# Patient Record
Sex: Female | Born: 1935 | Race: White | Hispanic: No | Smoking: Former smoker
Health system: Southern US, Community
[De-identification: ages and names within clinical notes are randomized; demographics above are authoritative.]

## PROBLEM LIST (undated history)

## (undated) DIAGNOSIS — I10 Essential (primary) hypertension: Secondary | ICD-10-CM

## (undated) DIAGNOSIS — M109 Gout, unspecified: Secondary | ICD-10-CM

## (undated) DIAGNOSIS — E079 Disorder of thyroid, unspecified: Secondary | ICD-10-CM

## (undated) DIAGNOSIS — I509 Heart failure, unspecified: Secondary | ICD-10-CM

## (undated) DIAGNOSIS — K219 Gastro-esophageal reflux disease without esophagitis: Secondary | ICD-10-CM

## (undated) DIAGNOSIS — T8859XA Other complications of anesthesia, initial encounter: Secondary | ICD-10-CM

## (undated) DIAGNOSIS — E039 Hypothyroidism, unspecified: Secondary | ICD-10-CM

## (undated) DIAGNOSIS — F419 Anxiety disorder, unspecified: Secondary | ICD-10-CM

## (undated) DIAGNOSIS — I4891 Unspecified atrial fibrillation: Secondary | ICD-10-CM

## (undated) DIAGNOSIS — Z972 Presence of dental prosthetic device (complete) (partial): Secondary | ICD-10-CM

## (undated) HISTORY — PX: APPENDECTOMY: SHX54

## (undated) HISTORY — PX: ABDOMINAL HYSTERECTOMY: SHX81

---

## 2003-10-19 ENCOUNTER — Other Ambulatory Visit: Payer: Self-pay

## 2007-06-20 ENCOUNTER — Ambulatory Visit: Payer: Self-pay | Admitting: Family Medicine

## 2013-02-10 DIAGNOSIS — N951 Menopausal and female climacteric states: Secondary | ICD-10-CM | POA: Insufficient documentation

## 2013-02-10 DIAGNOSIS — E785 Hyperlipidemia, unspecified: Secondary | ICD-10-CM | POA: Insufficient documentation

## 2013-02-10 DIAGNOSIS — E669 Obesity, unspecified: Secondary | ICD-10-CM | POA: Insufficient documentation

## 2014-04-29 ENCOUNTER — Ambulatory Visit: Payer: Self-pay | Admitting: Physician Assistant

## 2014-07-01 ENCOUNTER — Emergency Department: Payer: Self-pay | Admitting: Emergency Medicine

## 2015-03-28 DIAGNOSIS — R059 Cough, unspecified: Secondary | ICD-10-CM | POA: Insufficient documentation

## 2016-06-28 ENCOUNTER — Emergency Department: Payer: Medicare Other

## 2016-06-28 ENCOUNTER — Inpatient Hospital Stay
Admission: EM | Admit: 2016-06-28 | Discharge: 2016-07-02 | DRG: 193 | Disposition: A | Discharge status: Home / Self Care | Payer: Medicare Other | Attending: Internal Medicine | Admitting: Internal Medicine

## 2016-06-28 ENCOUNTER — Encounter: Payer: Self-pay | Admitting: *Deleted

## 2016-06-28 ENCOUNTER — Ambulatory Visit (INDEPENDENT_AMBULATORY_CARE_PROVIDER_SITE_OTHER): Payer: Medicare Other

## 2016-06-28 ENCOUNTER — Ambulatory Visit (INDEPENDENT_AMBULATORY_CARE_PROVIDER_SITE_OTHER)
Admission: EM | Admit: 2016-06-28 | Discharge: 2016-06-28 | Disposition: A | Discharge status: Other Acute Inpatient Hospital | Payer: Medicare Other | Source: Home / Self Care | Attending: Family Medicine | Admitting: Family Medicine

## 2016-06-28 DIAGNOSIS — I452 Bifascicular block: Secondary | ICD-10-CM

## 2016-06-28 DIAGNOSIS — J9601 Acute respiratory failure with hypoxia: Secondary | ICD-10-CM | POA: Diagnosis not present

## 2016-06-28 DIAGNOSIS — I4891 Unspecified atrial fibrillation: Secondary | ICD-10-CM | POA: Diagnosis present

## 2016-06-28 DIAGNOSIS — R0602 Shortness of breath: Secondary | ICD-10-CM | POA: Diagnosis not present

## 2016-06-28 DIAGNOSIS — I429 Cardiomyopathy, unspecified: Secondary | ICD-10-CM | POA: Diagnosis not present

## 2016-06-28 DIAGNOSIS — I700 Atherosclerosis of aorta: Secondary | ICD-10-CM | POA: Insufficient documentation

## 2016-06-28 DIAGNOSIS — R05 Cough: Secondary | ICD-10-CM | POA: Insufficient documentation

## 2016-06-28 DIAGNOSIS — I4510 Unspecified right bundle-branch block: Secondary | ICD-10-CM | POA: Insufficient documentation

## 2016-06-28 DIAGNOSIS — Z6834 Body mass index (BMI) 34.0-34.9, adult: Secondary | ICD-10-CM

## 2016-06-28 DIAGNOSIS — E669 Obesity, unspecified: Secondary | ICD-10-CM | POA: Diagnosis present

## 2016-06-28 DIAGNOSIS — J449 Chronic obstructive pulmonary disease, unspecified: Secondary | ICD-10-CM | POA: Diagnosis not present

## 2016-06-28 DIAGNOSIS — J189 Pneumonia, unspecified organism: Secondary | ICD-10-CM | POA: Diagnosis not present

## 2016-06-28 DIAGNOSIS — I444 Left anterior fascicular block: Secondary | ICD-10-CM | POA: Insufficient documentation

## 2016-06-28 DIAGNOSIS — I5021 Acute systolic (congestive) heart failure: Secondary | ICD-10-CM | POA: Diagnosis present

## 2016-06-28 DIAGNOSIS — E876 Hypokalemia: Secondary | ICD-10-CM | POA: Diagnosis not present

## 2016-06-28 DIAGNOSIS — I119 Hypertensive heart disease without heart failure: Secondary | ICD-10-CM

## 2016-06-28 DIAGNOSIS — Z7901 Long term (current) use of anticoagulants: Secondary | ICD-10-CM | POA: Diagnosis not present

## 2016-06-28 DIAGNOSIS — M419 Scoliosis, unspecified: Secondary | ICD-10-CM | POA: Insufficient documentation

## 2016-06-28 DIAGNOSIS — I110 Hypertensive heart disease with heart failure: Secondary | ICD-10-CM | POA: Diagnosis not present

## 2016-06-28 DIAGNOSIS — R0902 Hypoxemia: Secondary | ICD-10-CM

## 2016-06-28 DIAGNOSIS — Z90710 Acquired absence of both cervix and uterus: Secondary | ICD-10-CM | POA: Diagnosis not present

## 2016-06-28 DIAGNOSIS — F329 Major depressive disorder, single episode, unspecified: Secondary | ICD-10-CM | POA: Diagnosis not present

## 2016-06-28 DIAGNOSIS — Z79899 Other long term (current) drug therapy: Secondary | ICD-10-CM | POA: Diagnosis not present

## 2016-06-28 DIAGNOSIS — K219 Gastro-esophageal reflux disease without esophagitis: Secondary | ICD-10-CM | POA: Diagnosis present

## 2016-06-28 DIAGNOSIS — F419 Anxiety disorder, unspecified: Secondary | ICD-10-CM

## 2016-06-28 DIAGNOSIS — E079 Disorder of thyroid, unspecified: Secondary | ICD-10-CM | POA: Insufficient documentation

## 2016-06-28 DIAGNOSIS — E039 Hypothyroidism, unspecified: Secondary | ICD-10-CM | POA: Diagnosis not present

## 2016-06-28 DIAGNOSIS — I493 Ventricular premature depolarization: Secondary | ICD-10-CM

## 2016-06-28 DIAGNOSIS — E785 Hyperlipidemia, unspecified: Secondary | ICD-10-CM | POA: Diagnosis present

## 2016-06-28 DIAGNOSIS — R0600 Dyspnea, unspecified: Secondary | ICD-10-CM

## 2016-06-28 DIAGNOSIS — R062 Wheezing: Secondary | ICD-10-CM

## 2016-06-28 DIAGNOSIS — J9801 Acute bronchospasm: Secondary | ICD-10-CM

## 2016-06-28 DIAGNOSIS — Z87891 Personal history of nicotine dependence: Secondary | ICD-10-CM

## 2016-06-28 DIAGNOSIS — I517 Cardiomegaly: Secondary | ICD-10-CM | POA: Insufficient documentation

## 2016-06-28 DIAGNOSIS — I10 Essential (primary) hypertension: Secondary | ICD-10-CM | POA: Diagnosis present

## 2016-06-28 HISTORY — DX: Essential (primary) hypertension: I10

## 2016-06-28 HISTORY — DX: Gastro-esophageal reflux disease without esophagitis: K21.9

## 2016-06-28 HISTORY — DX: Anxiety disorder, unspecified: F41.9

## 2016-06-28 HISTORY — DX: Disorder of thyroid, unspecified: E07.9

## 2016-06-28 LAB — URINALYSIS, ROUTINE W REFLEX MICROSCOPIC
Bilirubin Urine: NEGATIVE
Glucose, UA: NEGATIVE mg/dL
Hgb urine dipstick: NEGATIVE
Ketones, ur: NEGATIVE mg/dL
Leukocytes, UA: NEGATIVE
Nitrite: NEGATIVE
Protein, ur: NEGATIVE mg/dL
Specific Gravity, Urine: 1.006 (ref 1.005–1.030)
pH: 6 (ref 5.0–8.0)

## 2016-06-28 LAB — CBC WITH DIFFERENTIAL/PLATELET
Basophils Absolute: 0.1 10*3/uL (ref 0–0.1)
Basophils Relative: 1 %
Eosinophils Absolute: 0 10*3/uL (ref 0–0.7)
Eosinophils Relative: 0 %
HCT: 43 % (ref 35.0–47.0)
Hemoglobin: 14.7 g/dL (ref 12.0–16.0)
Lymphocytes Relative: 13 %
Lymphs Abs: 1 10*3/uL (ref 1.0–3.6)
MCH: 31.6 pg (ref 26.0–34.0)
MCHC: 34.1 g/dL (ref 32.0–36.0)
MCV: 92.5 fL (ref 80.0–100.0)
Monocytes Absolute: 0.4 10*3/uL (ref 0.2–0.9)
Monocytes Relative: 6 %
Neutro Abs: 5.7 10*3/uL (ref 1.4–6.5)
Neutrophils Relative %: 80 %
Platelets: 203 10*3/uL (ref 150–440)
RBC: 4.64 MIL/uL (ref 3.80–5.20)
RDW: 12.6 % (ref 11.5–14.5)
WBC: 7.1 10*3/uL (ref 3.6–11.0)

## 2016-06-28 LAB — COMPREHENSIVE METABOLIC PANEL
ALT: 21 U/L (ref 14–54)
AST: 36 U/L (ref 15–41)
Albumin: 4.3 g/dL (ref 3.5–5.0)
Alkaline Phosphatase: 71 U/L (ref 38–126)
Anion gap: 11 (ref 5–15)
BUN: 18 mg/dL (ref 6–20)
CO2: 24 mmol/L (ref 22–32)
Calcium: 9.1 mg/dL (ref 8.9–10.3)
Chloride: 98 mmol/L — ABNORMAL LOW (ref 101–111)
Creatinine, Ser: 0.78 mg/dL (ref 0.44–1.00)
GFR calc Af Amer: >60 mL/min (ref >60)
GFR calc non Af Amer: >60 mL/min (ref >60)
Glucose, Bld: 127 mg/dL — ABNORMAL HIGH (ref 65–99)
Potassium: 3.2 mmol/L — ABNORMAL LOW (ref 3.5–5.1)
Sodium: 133 mmol/L — ABNORMAL LOW (ref 135–145)
Total Bilirubin: 0.1 mg/dL — ABNORMAL LOW (ref 0.3–1.2)
Total Protein: 8.3 g/dL — ABNORMAL HIGH (ref 6.5–8.1)

## 2016-06-28 LAB — BRAIN NATRIURETIC PEPTIDE: B Natriuretic Peptide: 174 pg/mL — ABNORMAL HIGH (ref 0.0–100.0)

## 2016-06-28 LAB — TROPONIN I: Troponin I: 0.03 ng/mL (ref <0.03)

## 2016-06-28 LAB — FIBRIN DERIVATIVES D-DIMER (ARMC ONLY): Fibrin derivatives D-dimer (ARMC): 1063 — ABNORMAL HIGH (ref 0–499)

## 2016-06-28 LAB — TSH: TSH: 1.138 u[IU]/mL (ref 0.350–4.500)

## 2016-06-28 MED ORDER — LEVALBUTEROL HCL 1.25 MG/0.5ML IN NEBU
INHALATION_SOLUTION | RESPIRATORY_TRACT | Status: AC
  Filled 2016-06-28: qty 0.5

## 2016-06-28 MED ORDER — LEVALBUTEROL HCL 1.25 MG/0.5ML IN NEBU
1.2500 mg | INHALATION_SOLUTION | Freq: Once | RESPIRATORY_TRACT | Status: AC
  Administered 2016-06-28: 1.25 mg via RESPIRATORY_TRACT
  Filled 2016-06-28: qty 0.5

## 2016-06-28 MED ORDER — IOPAMIDOL (ISOVUE-370) INJECTION 76%
75.0000 mL | Freq: Once | INTRAVENOUS | Status: AC | PRN
  Administered 2016-06-28: 75 mL via INTRAVENOUS

## 2016-06-28 MED ORDER — DILTIAZEM HCL 30 MG PO TABS
30.0000 mg | ORAL_TABLET | Freq: Once | ORAL | Status: AC
  Administered 2016-06-28: 30 mg via ORAL
  Filled 2016-06-28: qty 1

## 2016-06-28 MED ORDER — IPRATROPIUM-ALBUTEROL 0.5-2.5 (3) MG/3ML IN SOLN
3.0000 mL | Freq: Four times a day (QID) | RESPIRATORY_TRACT | Status: DC
  Administered 2016-06-28: 1.5 mL via RESPIRATORY_TRACT

## 2016-06-28 MED ORDER — DILTIAZEM HCL 25 MG/5ML IV SOLN
5.0000 mg | Freq: Once | INTRAVENOUS | Status: AC
  Administered 2016-06-28: 5 mg via INTRAVENOUS
  Filled 2016-06-28: qty 5

## 2016-06-28 MED ORDER — DEXTROSE 5 % IV SOLN: 1.0000 g | Freq: Once | INTRAVENOUS | Status: DC

## 2016-06-28 MED ORDER — CEFTRIAXONE SODIUM-DEXTROSE 1-3.74 GM-% IV SOLR
1.0000 g | Freq: Once | INTRAVENOUS | Status: AC
Start: 2016-06-28 — End: 2016-06-28
  Administered 2016-06-28: 1 g via INTRAVENOUS
  Filled 2016-06-28: qty 50

## 2016-06-28 MED ORDER — DEXTROSE 5 % IV SOLN
500.0000 mg | Freq: Once | INTRAVENOUS | Status: AC
  Administered 2016-06-28: 500 mg via INTRAVENOUS
  Filled 2016-06-28: qty 500

## 2016-06-28 MED ORDER — BENZONATATE 100 MG PO CAPS
200.0000 mg | ORAL_CAPSULE | Freq: Three times a day (TID) | ORAL | Status: DC | PRN
  Administered 2016-06-29: 200 mg via ORAL
  Filled 2016-06-28: qty 2

## 2016-06-28 MED ORDER — IPRATROPIUM-ALBUTEROL 0.5-2.5 (3) MG/3ML IN SOLN
3.0000 mL | Freq: Once | RESPIRATORY_TRACT | Status: AC
  Administered 2016-06-28: 3 mL via RESPIRATORY_TRACT

## 2016-06-28 NOTE — ED Triage Notes (Signed)
Pt went to Urgent care for shortness of breathing because of a cold and cough. She was given breathing treatments, and feels better. She was subsequently given an ECG, which showed new onset A-fib. Pt still has some sob evident in her speech patterns, and coughs some, but states it is much better. She also indicates that her blood pressure was high at the urgent care. Pt alert & oriented. NAD noted.

## 2016-06-28 NOTE — ED Triage Notes (Signed)
Patient started having symptoms of cough and cold 2 days ago. Today she is experiencing SOB.

## 2016-06-28 NOTE — ED Provider Notes (Addendum)
[facility] Emergency Department Provider Note   ____________________________________________   First MD Initiated Contact with Patient 06/28/16 1903     (approximate)  I have reviewed the triage vital signs and the nursing notes.   HISTORY  Chief Complaint Shortness of Breath and Atrial Fibrillation    HPI Heather [name] is a 82 y.o. female sent from urgent care for atrial fibrillation. Patient and family reports she has been getting short of breath much more easily for the last several weeks. She's been having a cough and cold and coughing up occasional small amounts of yellow sputum. She went to the urgent care for this today and got a neb and then lightheaded EKG which showed new onset atrial fibrillation. Patient desats to 2986 in the emergency room. She is not having any chest pain Patient does not drink alcohol and does not drink caffeine. Patient has a history of hypothyroidism and is on thyroid replacement.  Past Medical History:  Diagnosis Date  . Anxiety   . Hypertension   . Thyroid disease     There are no active problems to display for this patient.   Past Surgical History:  Procedure Laterality Date  . ABDOMINAL HYSTERECTOMY    . APPENDECTOMY      Prior to Admission medications   Medication Sig Start Date End Date Taking? Authorizing Provider  amLODipine (NORVASC) 2.5 MG tablet Take 2.5 mg by mouth 2 (two) times daily.    Yes Historical Provider, MD  levothyroxine (SYNTHROID, LEVOTHROID) 50 MCG tablet Take 50 mcg by mouth daily before breakfast.   Yes Historical Provider, MD  losartan (COZAAR) 25 MG tablet Take 25 mg by mouth 2 (two) times daily.    Yes Historical Provider, MD  sertraline (ZOLOFT) 25 MG tablet Take 25 mg by mouth daily. Take 1 tablet in morning and 1/2 tablet every evening.   Yes Historical Provider, MD  albuterol (PROVENTIL) (2.5 MG/3ML) 0.083% nebulizer solution Take 2.5 mg by nebulization every 6 (six) hours as  needed for wheezing or shortness of breath.    Historical Provider, MD    Allergies Patient has no known allergies.  History reviewed. No pertinent family history.  Social History Social History  Substance Use Topics  . Smoking status: Former Games developermoker  . Smokeless tobacco: Never Used  . Alcohol use No    Review of Systems Constitutional: No fever/chills Eyes: No visual changes. ENT: No sore throat. Cardiovascular: Denies chest pain. Respiratory:shortness of breath. Gastrointestinal: No abdominal pain.  No nausea, no vomiting.  No diarrhea.  No constipation. Genitourinary: Negative for dysuria. Musculoskeletal: Negative for back pain. Skin: Negative for rash. Neurological: Negative for headaches, focal weakness or numbness.  10-point ROS otherwise negative.  ____________________________________________   PHYSICAL EXAM:  VITAL SIGNS: ED Triage Vitals [06/28/16 1838]  Enc Vitals Group     BP 134/83     Pulse Rate (!) 107     Resp 20     Temp 98 F (36.7 C)     Temp Source Oral     SpO2 95 %     Weight 200 lb (90.7 kg)     Height 5\' 5"  (1.651 m)     Head Circumference      Peak Flow      Pain Score      Pain Loc      Pain Edu?      Excl. in GC?     Constitutional: Alert and oriented. Well appearing and in  no acute distress. Eyes: Conjunctivae are normal. PERRL. EOMI. Head: Atraumatic. Nose: No congestion/rhinnorhea. Mouth/Throat: Mucous membranes are moist.  Oropharynx non-erythematous. Neck: No stridor.  Cardiovascular: Irregularly irregular tachycardia. Grossly normal heart sounds.  Good peripheral circulation. Respiratory: Normal respiratory effort.  No retractions. Lungs crackles in bases Gastrointestinal: Soft and nontender. No distention. No abdominal bruits. No CVA tenderness. Musculoskeletal: No lower extremity tenderness trace edema in the legs.  No joint effusions.   ____________________________________________   LABS (all labs ordered are  listed, but only abnormal results are displayed)  Labs Reviewed  BRAIN NATRIURETIC PEPTIDE - Abnormal; Notable for the following:       Result Value   B Natriuretic Peptide 174.0 (*)    All other components within normal limits  FIBRIN DERIVATIVES D-DIMER ([facility] ONLY) - Abnormal; Notable for the following:    Fibrin derivatives D-dimer ([facility]) 1,063 (*)    All other components within normal limits  CBC WITH DIFFERENTIAL/PLATELET  TROPONIN I  COMPREHENSIVE METABOLIC PANEL  TSH   ____________________________________________  EKG  EKG read and interpreted by me shows an irregularly irregular rhythm with occasional P waves left axis lateral T-wave inversions right bundle branch block heart rate is 127 ____________________________________________  RADIOLOGY  Chest x-ray shows cardiomegaly and to me appears to show some congestive failure as well. ____________________________________________   PROCEDURES  Procedure(s) performed:   Procedures  Critical Care performed:  ____________________________________________   INITIAL IMPRESSION / ASSESSMENT AND PLAN / ED COURSE  Pertinent labs & imaging results that were available during my care of the patient were reviewed by me and considered in my medical decision making (see chart for details).    Clinical Course      ____________________________________________   FINAL CLINICAL IMPRESSION(S) / ED DIAGNOSES  Final diagnoses:  Hypoxia  New onset atrial fibrillation (HCC)  Dyspnea, unspecified type  Acute systolic congestive heart failure (HCC)      NEW MEDICATIONS STARTED DURING THIS VISIT:  New Prescriptions   No medications on file     Note:  This document was prepared using Dragon voice recognition software and may include unintentional dictation errors.    Arnaldo NatalPaul F Deloyd Handy, MD 06/28/16 2033  Awaiting results of ct angio of lungs. Signed out to Dr Pixie CasinoP.    Jameal Razzano F Chalice Philbert, MD 06/28/16 [phone]

## 2016-06-28 NOTE — ED Provider Notes (Addendum)
[facility]    CSN: [id number] Arrival date & time: 06/28/16  1538     History   Chief Complaint Chief Complaint  Patient presents with  . Shortness of Breath    HPI Heather [name] is a 82 y.o. female.   82 yo female with a c/o 2 days of shortness of breath and wheezing associated with some mild nasal congestion. Patient denies any chest pains, dizziness, syncope, palpitations, fevers, chills. Has a h/o hypertension.    The history is provided by the patient.  Shortness of Breath    Past Medical History:  Diagnosis Date  . Anxiety   . Hypertension   . Thyroid disease     There are no active problems to display for this patient.   Past Surgical History:  Procedure Laterality Date  . ABDOMINAL HYSTERECTOMY    . APPENDECTOMY      OB History    No data available       Home Medications    Prior to Admission medications   Medication Sig Start Date End Date Taking? Authorizing Provider  amLODipine (NORVASC) 2.5 MG tablet Take 2.5 mg by mouth 2 (two) times daily.    Yes Historical Provider, MD  levothyroxine (SYNTHROID, LEVOTHROID) 50 MCG tablet Take 50 mcg by mouth daily before breakfast.   Yes Historical Provider, MD  losartan (COZAAR) 25 MG tablet Take 25 mg by mouth 2 (two) times daily.    Yes Historical Provider, MD  sertraline (ZOLOFT) 25 MG tablet Take 25 mg by mouth daily. Take 1 tablet in morning and 1/2 tablet every evening.   Yes Historical Provider, MD  albuterol (PROVENTIL) (2.5 MG/3ML) 0.083% nebulizer solution Take 2.5 mg by nebulization every 6 (six) hours as needed for wheezing or shortness of breath.    Historical Provider, MD    Family History History reviewed. No pertinent family history.  Social History Social History  Substance Use Topics  . Smoking status: Former Games developermoker  . Smokeless tobacco: Never Used  . Alcohol use No     Allergies   Patient has no known allergies.   Review of Systems Review of Systems    Respiratory: Positive for shortness of breath.      Physical Exam Triage Vital Signs ED Triage Vitals  Enc Vitals Group     BP 06/28/16 1558 (!) 160/95     Pulse Rate 06/28/16 1558 (!) 110     Resp 06/28/16 1558 (!) 22     Temp 06/28/16 1558 97.9 F (36.6 C)     Temp Source 06/28/16 1558 Oral     SpO2 06/28/16 1558 94 %     Weight 06/28/16 1601 200 lb (90.7 kg)     Height 06/28/16 1601 5\' 5"  (1.651 m)     Head Circumference --      Peak Flow --      Pain Score 06/28/16 1607 0     Pain Loc --      Pain Edu? --      Excl. in GC? --    No data found.   Updated Vital Signs BP 131/90   Pulse (!) 109   Temp 97.9 F (36.6 C) (Oral)   Resp 18   Ht 5\' 5"  (1.651 m)   Wt 200 lb (90.7 kg)   SpO2 94%   BMI 33.28 kg/m   Visual Acuity Right Eye Distance:   Left Eye Distance:   Bilateral Distance:    Right Eye Near:  Left Eye Near:    Bilateral Near:     Physical Exam  Constitutional: She appears well-developed and well-nourished. No distress.  HENT:  Head: Normocephalic and atraumatic.  Right Ear: Tympanic membrane, external ear and ear canal normal.  Left Ear: Tympanic membrane, external ear and ear canal normal.  Nose: Mucosal edema and rhinorrhea present. No nose lacerations, sinus tenderness, nasal deformity, septal deviation or nasal septal hematoma. No epistaxis.  No foreign bodies. Right sinus exhibits maxillary sinus tenderness and frontal sinus tenderness. Left sinus exhibits maxillary sinus tenderness and frontal sinus tenderness.  Mouth/Throat: Uvula is midline, oropharynx is clear and moist and mucous membranes are normal. No oropharyngeal exudate.  Eyes: Conjunctivae and EOM are normal. Pupils are equal, round, and reactive to light. Right eye exhibits no discharge. Left eye exhibits no discharge. No scleral icterus.  Neck: Normal range of motion. Neck supple. No thyromegaly present.  Cardiovascular: Normal heart sounds.  An irregularly irregular rhythm  present. Tachycardia present.   Pulmonary/Chest: Effort normal. No respiratory distress. She has wheezes (diffuse inspiritory and expiratory). She has no rales.  Lymphadenopathy:    She has no cervical adenopathy.  Skin: She is not diaphoretic.  Nursing note and vitals reviewed.    UC Treatments / Results  Labs (all labs ordered are listed, but only abnormal results are displayed) Labs Reviewed - No data to display  EKG  EKG Interpretation None       Radiology Dg Chest 2 View  Result Date: 06/28/2016 CLINICAL DATA:  Cough and shortness of breath for 1 week. EXAM: CHEST  2 VIEW COMPARISON:  None. FINDINGS: There is cardiomegaly without edema. The lungs are clear. No pneumothorax or pleural effusion. Aortic atherosclerosis is identified. Scoliosis is noted. Advanced degenerative changes seen about the left glenohumeral joint. IMPRESSION: Cardiomegaly without acute disease. Atherosclerosis. Electronically Signed   By: Drusilla Kannerhomas  Dalessio M.D.   On: 06/28/2016 16:45   Ct Angio Chest Pe W And/or Wo Contrast  Result Date: 06/28/2016 CLINICAL DATA:  Cough and cold, shortness of breath with elevated D-dimer EXAM: CT ANGIOGRAPHY CHEST WITH CONTRAST TECHNIQUE: Multidetector CT imaging of the chest was performed using the standard protocol during bolus administration of intravenous contrast. Multiplanar CT image reconstructions and MIPs were obtained to evaluate the vascular anatomy. CONTRAST:  75 mL Isovue 370 intravenous COMPARISON:  Chest x-ray 06/28/2016 FINDINGS: Cardiovascular: Satisfactory opacification of the pulmonary arteries to the segmental level. No evidence of pulmonary embolism. There is cardiomegaly with multi chamber enlargement. No large pericardial effusion. There are coronary artery calcifications. There is atherosclerosis of the aorta. Ascending segment is nonenlarged. Mild ectasia of the distal arch and proximal descending thoracic aorta. Mediastinum/Nodes: No significantly  enlarged mediastinal or hilar lymph nodes. Thyroid is unremarkable. Trachea is midline. Esophagus grossly unremarkable. Lungs/Pleura: No pleural effusion or pneumothorax is visualized. Scattered ground-glass densities/mild mosaic pattern. Mild centrilobular nodular densities within the left lower lobe, suggestive of respiratory infection, to include atypical organisms. Upper Abdomen: Partially visualized calcified stones in the gallbladder. No acute abnormalities. Musculoskeletal: Degenerative changes of the spine. No acute or suspicious bone lesions. Review of the MIP images confirms the above findings. IMPRESSION: 1. No CT evidence for acute pulmonary embolus. 2. Cardiomegaly. 3. Scattered bilateral areas of ground-glass density/mild mosaic pattern, could relate to small airways disease or possible edema. Mild centrilobular nodular densities in the left lower lobe would be compatible with respiratory infection, to include atypical organisms. 4. Calcified gallstones Electronically Signed   By: Adrian ProwsKim  Fujinaga M.D.  On: 06/28/2016 21:19    Procedures .EKG Date/Time: 06/28/2016 9:36 PM Performed by: Payton Mccallum Authorized by: Payton Mccallum   ECG reviewed by ED Physician in the absence of a cardiologist: yes   Previous ECG:    Previous ECG:  Unavailable Interpretation:    Interpretation: abnormal   Rate:    ECG rate assessment: tachycardic   Rhythm:    Rhythm: atrial fibrillation   QRS:    QRS axis:  Normal Conduction:    Conduction: abnormal     Abnormal conduction: complete RBBB and LAFB   ST segments:    ST segments:  Non-specific T waves:    T waves: inverted     Inverted:  V1, V2, V3, V4, V5 and V6   (including critical care time)  Medications Ordered in UC Medications  ipratropium-albuterol (DUONEB) 0.5-2.5 (3) MG/3ML nebulizer solution 3 mL (3 mLs Nebulization Given 06/28/16 1616)     Initial Impression / Assessment and Plan / UC Course  I have reviewed the triage vital  signs and the nursing notes.  Pertinent labs & imaging results that were available during my care of the patient were reviewed by me and considered in my medical decision making (see chart for details).  Clinical Course       Final Clinical Impressions(s) / UC Diagnoses   Final diagnoses:  Wheezing  Bronchospasm  Atrial fibrillation, new onset (HCC)  SOB (shortness of breath)    New Prescriptions Discharge Medication List as of 06/28/2016  6:10 PM     1. ekg/x-ray results and diagnosis reviewed with patient and grandson 2. Patient given duoneb treatment with improvement of wheezing/bronchospasm  3. Due to new onset atrial fibrillation and current symptoms, recommend patient go to ED for further evaluation and management; patient verbalizes understanding and will be driven to ED by grandson; report called to triage RN at [facility] ED   Payton Mccallum, MD 06/28/16 2135    Payton Mccallum, MD 06/28/16 2138

## 2016-06-28 NOTE — H&P (Addendum)
[facility] [place] at [facility]   PATIENT NAME: Heather [name]    MR#:  [id number]  DATE OF BIRTH:  13-Jan-1932  DATE OF ADMISSION:  06/28/2016  PRIMARY CARE PHYSICIAN: Selina CooleyAXELBANK,ARTHUR, MD   REQUESTING/REFERRING PHYSICIAN: Lenard LancePaduchowski, MD  CHIEF COMPLAINT:   Chief Complaint  Patient presents with  . Shortness of Breath  . Atrial Fibrillation    HISTORY OF PRESENT ILLNESS:  Heather [name]  is a [age] y.o. female who presents with Intermittent but increasing shortness of breath for the past 3-4 weeks. Over the last 2-3 days she developed upper respiratory symptoms, which progressed to lower respiratory symptoms including wheezing and rhonchi. She came in the ED for evaluation tonight and was found to be in A. fib, with no prior diagnosis of A. fib. Also found to have likely pneumonia on imaging. Hospitals were called for admission and further treatment  PAST MEDICAL HISTORY:   Past Medical History:  Diagnosis Date  . Anxiety   . GERD (gastroesophageal reflux disease)   . Hypertension   . Thyroid disease     PAST SURGICAL HISTORY:   Past Surgical History:  Procedure Laterality Date  . ABDOMINAL HYSTERECTOMY    . APPENDECTOMY      SOCIAL HISTORY:   Social History  Substance Use Topics  . Smoking status: Former Games developermoker  . Smokeless tobacco: Never Used  . Alcohol use No    FAMILY HISTORY:   Family History  Problem Relation Age of Onset  . Family history unknown: Yes    DRUG ALLERGIES:  No Known Allergies  MEDICATIONS AT HOME:   Prior to Admission medications   Medication Sig Start Date End Date Taking? Authorizing Provider  amLODipine (NORVASC) 2.5 MG tablet Take 2.5 mg by mouth 2 (two) times daily.    Yes Historical Provider, MD  levothyroxine (SYNTHROID, LEVOTHROID) 50 MCG tablet Take 50 mcg by mouth daily before breakfast.   Yes Historical Provider, MD  losartan (COZAAR) 25 MG tablet Take 25 mg by mouth 2 (two) times daily.    Yes  Historical Provider, MD  sertraline (ZOLOFT) 25 MG tablet Take 25 mg by mouth daily. Take 1 tablet in morning and 1/2 tablet every evening.   Yes Historical Provider, MD  albuterol (PROVENTIL) (2.5 MG/3ML) 0.083% nebulizer solution Take 2.5 mg by nebulization every 6 (six) hours as needed for wheezing or shortness of breath.    Historical Provider, MD    REVIEW OF SYSTEMS:  Review of Systems  Constitutional: Negative for chills, fever, malaise/fatigue and weight loss.  HENT: Negative for ear pain, hearing loss and tinnitus.   Eyes: Negative for blurred vision, double vision, pain and redness.  Respiratory: Positive for cough, sputum production and shortness of breath. Negative for hemoptysis.   Cardiovascular: Negative for chest pain, palpitations, orthopnea and leg swelling.  Gastrointestinal: Negative for abdominal pain, constipation, diarrhea, nausea and vomiting.  Genitourinary: Negative for dysuria, frequency and hematuria.  Musculoskeletal: Negative for back pain, joint pain and neck pain.  Skin:       No acne, rash, or lesions  Neurological: Negative for dizziness, tremors, focal weakness and weakness.  Endo/Heme/Allergies: Negative for polydipsia. Does not bruise/bleed easily.  Psychiatric/Behavioral: Negative for depression. The patient is not nervous/anxious and does not have insomnia.      VITAL SIGNS:   Vitals:   06/28/16 2115 06/28/16 2130 06/28/16 2145 06/28/16 2200  BP: (!) 142/82 (!) 141/82 (!) 130/101 133/80  Pulse: (!) 45 88 78 97  Resp: (!) 25 (!) 22 (!) 22 (!) 25  Temp:      TempSrc:      SpO2: 91% 99% 94% 99%  Weight:      Height:       Wt Readings from Last 3 Encounters:  06/28/16 90.7 kg (200 lb)  06/28/16 90.7 kg (200 lb)    PHYSICAL EXAMINATION:  Physical Exam  Vitals reviewed. Constitutional: She is oriented to person, place, and time. She appears well-developed and well-nourished. No distress.  HENT:  Head: Normocephalic and atraumatic.   Mouth/Throat: Oropharynx is clear and moist.  Eyes: Conjunctivae and EOM are normal. Pupils are equal, round, and reactive to light. No scleral icterus.  Neck: Normal range of motion. Neck supple. No JVD present. No thyromegaly present.  Cardiovascular: Intact distal pulses.  Exam reveals no gallop and no friction rub.   No murmur heard. Tachycardic, irregular rhythm  Respiratory: Effort normal. No respiratory distress. She has wheezes. She has no rales.  Rhonchi left greater than right lung fields  GI: Soft. Bowel sounds are normal. She exhibits no distension. There is no tenderness.  Musculoskeletal: Normal range of motion. She exhibits no edema.  No arthritis, no gout  Lymphadenopathy:    She has no cervical adenopathy.  Neurological: She is alert and oriented to person, place, and time. No cranial nerve deficit.  No dysarthria, no aphasia  Skin: Skin is warm and dry. No rash noted. No erythema.  Psychiatric: She has a normal mood and affect. Her behavior is normal. Judgment and thought content normal.    LABORATORY PANEL:   CBC  Recent Labs Lab 06/28/16 1843  WBC 7.1  HGB 14.7  HCT 43.0  PLT 203   ------------------------------------------------------------------------------------------------------------------  Chemistries   Recent Labs Lab 06/28/16 1843  NA 133*  K 3.2*  CL 98*  CO2 24  GLUCOSE 127*  BUN 18  CREATININE 0.78  CALCIUM 9.1  AST 36  ALT 21  ALKPHOS 71  BILITOT 0.1*   ------------------------------------------------------------------------------------------------------------------  Cardiac Enzymes  Recent Labs Lab 06/28/16 1843  TROPONINI 0.03*   ------------------------------------------------------------------------------------------------------------------  RADIOLOGY:  Dg Chest 2 View  Result Date: 06/28/2016 CLINICAL DATA:  Cough and shortness of breath for 1 week. EXAM: CHEST  2 VIEW COMPARISON:  None. FINDINGS: There is  cardiomegaly without edema. The lungs are clear. No pneumothorax or pleural effusion. Aortic atherosclerosis is identified. Scoliosis is noted. Advanced degenerative changes seen about the left glenohumeral joint. IMPRESSION: Cardiomegaly without acute disease. Atherosclerosis. Electronically Signed   By: Drusilla Kannerhomas  Dalessio M.D.   On: 06/28/2016 16:45   Ct Angio Chest Pe W And/or Wo Contrast  Result Date: 06/28/2016 CLINICAL DATA:  Cough and cold, shortness of breath with elevated D-dimer EXAM: CT ANGIOGRAPHY CHEST WITH CONTRAST TECHNIQUE: Multidetector CT imaging of the chest was performed using the standard protocol during bolus administration of intravenous contrast. Multiplanar CT image reconstructions and MIPs were obtained to evaluate the vascular anatomy. CONTRAST:  75 mL Isovue 370 intravenous COMPARISON:  Chest x-ray 06/28/2016 FINDINGS: Cardiovascular: Satisfactory opacification of the pulmonary arteries to the segmental level. No evidence of pulmonary embolism. There is cardiomegaly with multi chamber enlargement. No large pericardial effusion. There are coronary artery calcifications. There is atherosclerosis of the aorta. Ascending segment is nonenlarged. Mild ectasia of the distal arch and proximal descending thoracic aorta. Mediastinum/Nodes: No significantly enlarged mediastinal or hilar lymph nodes. Thyroid is unremarkable. Trachea is midline. Esophagus grossly unremarkable. Lungs/Pleura: No pleural effusion or pneumothorax is visualized. Scattered ground-glass  densities/mild mosaic pattern. Mild centrilobular nodular densities within the left lower lobe, suggestive of respiratory infection, to include atypical organisms. Upper Abdomen: Partially visualized calcified stones in the gallbladder. No acute abnormalities. Musculoskeletal: Degenerative changes of the spine. No acute or suspicious bone lesions. Review of the MIP images confirms the above findings. IMPRESSION: 1. No CT evidence for acute  pulmonary embolus. 2. Cardiomegaly. 3. Scattered bilateral areas of ground-glass density/mild mosaic pattern, could relate to small airways disease or possible edema. Mild centrilobular nodular densities in the left lower lobe would be compatible with respiratory infection, to include atypical organisms. 4. Calcified gallstones Electronically Signed   By: Jasmine Pang M.D.   On: 06/28/2016 21:19    EKG:   Orders placed or performed during the hospital encounter of 06/28/16  . EKG 12-Lead  . EKG 12-Lead  . ED EKG within 10 minutes  . ED EKG within 10 minutes    IMPRESSION AND PLAN:  Principal Problem:   CAP (community acquired pneumonia) - IV antibiotics started in the ED, continue on admission. Sputum culture ordered. Active Problems:   New onset atrial fibrillation - patient states that her shortness of breath has been getting progressively worse over the last 4 weeks or so. Potentially related to her A. fib. We will trend her cardiac enzymes tonight and get an echocardiogram in the morning as well as a cardiology consult.   Anxiety - home dose anxiolytics   HTN (hypertension) - continue home meds   Hypothyroidism - home dose thyroid replacement   All the records are reviewed and case discussed with ED provider. Management plans discussed with the patient and/or family.  DVT PROPHYLAXIS: SubQ lovenox  GI PROPHYLAXIS: None  ADMISSION STATUS: Inpatient  CODE STATUS: Full Code Status History    This patient does not have a recorded code status. Please follow your organizational policy for patients in this situation.      TOTAL TIME TAKING CARE OF THIS PATIENT: 45 minutes.    Mariel Gaudin FIELDING 06/28/2016, 10:15 PM  [employer] [facility]  Office  [phone]  CC: Primary care physician; Selina Cooley, MD

## 2016-06-28 NOTE — ED Provider Notes (Signed)
-----------------------------------------   9:47 PM on 06/28/2016 -----------------------------------------  Patient care assumed from Dr. Juliette AlcideMelinda. CT scan negative for PE, but shows likely atypical pneumonia. We'll cover with antibiotics, check blood cultures. Patient remains in atrial fibrillation although currently rate controlled around 95 bpm. We will dose oral diltiazem to hopefully maintain a controlled rhythm. Patient will be admitted to the hospital for further treatment.   Minna AntisKevin Trent Theisen, MD 06/28/16 [phone]

## 2016-06-28 NOTE — ED Notes (Signed)
Pt assisted to ambulate in room for oxygen depletion investigation. Pts O2 at rest 96%, on walk of 5310ft, O2 86%. MD present at bedside. Oxygen set up at bedside if needed.

## 2016-06-28 NOTE — Discharge Instructions (Signed)
New onset atrial fibrillation: discussed with patient and grandson that I would recommend further evaluation in the emergency department

## 2016-06-29 ENCOUNTER — Inpatient Hospital Stay
Admit: 2016-06-29 | Discharge: 2016-06-29 | Disposition: A | Discharge status: Home / Self Care | Payer: Medicare Other | Attending: Internal Medicine | Admitting: Internal Medicine

## 2016-06-29 ENCOUNTER — Encounter: Payer: Self-pay | Admitting: *Deleted

## 2016-06-29 DIAGNOSIS — R0902 Hypoxemia: Secondary | ICD-10-CM | POA: Diagnosis not present

## 2016-06-29 DIAGNOSIS — J189 Pneumonia, unspecified organism: Secondary | ICD-10-CM | POA: Diagnosis not present

## 2016-06-29 LAB — BASIC METABOLIC PANEL
Anion gap: 8 (ref 5–15)
BUN: 15 mg/dL (ref 6–20)
CO2: 26 mmol/L (ref 22–32)
Calcium: 8.8 mg/dL — ABNORMAL LOW (ref 8.9–10.3)
Chloride: 100 mmol/L — ABNORMAL LOW (ref 101–111)
Creatinine, Ser: 0.79 mg/dL (ref 0.44–1.00)
GFR calc Af Amer: >60 mL/min (ref >60)
GFR calc non Af Amer: >60 mL/min (ref >60)
Glucose, Bld: 98 mg/dL (ref 65–99)
Potassium: 3.2 mmol/L — ABNORMAL LOW (ref 3.5–5.1)
Sodium: 134 mmol/L — ABNORMAL LOW (ref 135–145)

## 2016-06-29 LAB — CBC
HCT: 41.4 % (ref 35.0–47.0)
Hemoglobin: 14.1 g/dL (ref 12.0–16.0)
MCH: 30.7 pg (ref 26.0–34.0)
MCHC: 34 g/dL (ref 32.0–36.0)
MCV: 90.2 fL (ref 80.0–100.0)
Platelets: 208 10*3/uL (ref 150–440)
RBC: 4.58 MIL/uL (ref 3.80–5.20)
RDW: 12.7 % (ref 11.5–14.5)
WBC: 6.8 10*3/uL (ref 3.6–11.0)

## 2016-06-29 LAB — ECHOCARDIOGRAM COMPLETE
Height: 65 in
Weight: 3276.8 oz

## 2016-06-29 LAB — TROPONIN I
Troponin I: 0.04 ng/mL (ref <0.03)
Troponin I: 0.05 ng/mL (ref <0.03)
Troponin I: 0.03 ng/mL (ref <0.03)

## 2016-06-29 MED ORDER — LOSARTAN POTASSIUM 25 MG PO TABS
25.0000 mg | ORAL_TABLET | Freq: Two times a day (BID) | ORAL | Status: DC
  Administered 2016-06-29 – 2016-07-02 (×7): 25 mg via ORAL
  Filled 2016-06-29 (×7): qty 1

## 2016-06-29 MED ORDER — SERTRALINE HCL 25 MG PO TABS
12.5000 mg | ORAL_TABLET | Freq: Every day | ORAL | Status: DC
  Administered 2016-06-29 – 2016-07-01 (×3): 12.5 mg via ORAL
  Filled 2016-06-29 (×3): qty 1
  Filled 2016-06-29: qty 0.5

## 2016-06-29 MED ORDER — ENOXAPARIN SODIUM 40 MG/0.4ML ~~LOC~~ SOLN
40.0000 mg | SUBCUTANEOUS | Status: DC
  Administered 2016-06-29 – 2016-07-02 (×4): 40 mg via SUBCUTANEOUS
  Filled 2016-06-29 (×4): qty 0.4

## 2016-06-29 MED ORDER — ACETAMINOPHEN 650 MG RE SUPP: 650.0000 mg | Freq: Four times a day (QID) | RECTAL | Status: DC | PRN

## 2016-06-29 MED ORDER — LEVALBUTEROL HCL 0.63 MG/3ML IN NEBU
0.6300 mg | INHALATION_SOLUTION | Freq: Four times a day (QID) | RESPIRATORY_TRACT | Status: DC | PRN
  Administered 2016-06-29 (×2): 0.63 mg via RESPIRATORY_TRACT
  Filled 2016-06-29 (×2): qty 3

## 2016-06-29 MED ORDER — ONDANSETRON HCL 4 MG PO TABS: 4.0000 mg | ORAL_TABLET | Freq: Four times a day (QID) | ORAL | Status: DC | PRN

## 2016-06-29 MED ORDER — DIPHENHYDRAMINE HCL 50 MG/ML IJ SOLN
12.5000 mg | Freq: Once | INTRAMUSCULAR | Status: AC
  Administered 2016-06-29: 12.5 mg via INTRAVENOUS
  Filled 2016-06-29: qty 0.25

## 2016-06-29 MED ORDER — POTASSIUM CHLORIDE CRYS ER 20 MEQ PO TBCR
40.0000 meq | EXTENDED_RELEASE_TABLET | Freq: Once | ORAL | Status: AC
Start: 2016-06-29 — End: 2016-06-29
  Administered 2016-06-29: 40 meq via ORAL
  Filled 2016-06-29: qty 2

## 2016-06-29 MED ORDER — AZITHROMYCIN 250 MG PO TABS
500.0000 mg | ORAL_TABLET | Freq: Every day | ORAL | Status: DC
  Administered 2016-06-30 – 2016-07-02 (×3): 500 mg via ORAL
  Filled 2016-06-29 (×4): qty 2

## 2016-06-29 MED ORDER — DEXTROSE 5 % IV SOLN
500.0000 mg | INTRAVENOUS | Status: DC
Start: 2016-06-29 — End: 2016-06-29
  Filled 2016-06-29: qty 500

## 2016-06-29 MED ORDER — ONDANSETRON HCL 4 MG/2ML IJ SOLN: 4.0000 mg | Freq: Four times a day (QID) | INTRAMUSCULAR | Status: DC | PRN

## 2016-06-29 MED ORDER — DILTIAZEM HCL ER COATED BEADS 120 MG PO CP24
120.0000 mg | ORAL_CAPSULE | Freq: Every day | ORAL | Status: DC
  Administered 2016-06-29 – 2016-07-02 (×4): 120 mg via ORAL
  Filled 2016-06-29 (×4): qty 1

## 2016-06-29 MED ORDER — ACETAMINOPHEN 325 MG PO TABS
650.0000 mg | ORAL_TABLET | Freq: Four times a day (QID) | ORAL | Status: DC | PRN
  Administered 2016-06-29 – 2016-06-30 (×2): 650 mg via ORAL
  Filled 2016-06-29 (×2): qty 2

## 2016-06-29 MED ORDER — CEFTRIAXONE SODIUM-DEXTROSE 1-3.74 GM-% IV SOLR
1.0000 g | INTRAVENOUS | Status: DC
  Administered 2016-06-30 – 2016-07-02 (×3): 1 g via INTRAVENOUS
  Filled 2016-06-29 (×4): qty 50

## 2016-06-29 MED ORDER — DEXTROSE 5 % IV SOLN: 1.0000 g | INTRAVENOUS | Status: DC

## 2016-06-29 MED ORDER — SERTRALINE HCL 50 MG PO TABS
25.0000 mg | ORAL_TABLET | Freq: Every day | ORAL | Status: DC
  Administered 2016-06-29 – 2016-07-02 (×4): 25 mg via ORAL
  Filled 2016-06-29 (×4): qty 1

## 2016-06-29 MED ORDER — LEVOTHYROXINE SODIUM 50 MCG PO TABS
50.0000 ug | ORAL_TABLET | Freq: Every day | ORAL | Status: DC
  Administered 2016-06-29 – 2016-07-02 (×4): 50 ug via ORAL
  Filled 2016-06-29 (×4): qty 1

## 2016-06-29 MED ORDER — AMLODIPINE BESYLATE 5 MG PO TABS: 2.5000 mg | ORAL_TABLET | Freq: Two times a day (BID) | ORAL | Status: DC

## 2016-06-29 MED ORDER — ZOLPIDEM TARTRATE 5 MG PO TABS: 5.0000 mg | ORAL_TABLET | Freq: Once | ORAL | Status: DC | PRN

## 2016-06-29 NOTE — Progress Notes (Signed)
Pt complaining of not being able to sleep. MD paged. Dr. Sheryle Hailiamond to put in orders for benadryl. No further complaints, will continue to monitor. Shirley FriarAlexis Miller, RN, BSN

## 2016-06-29 NOTE — Consult Note (Signed)
Reason for Consult: Atrial fibrillation shortness of breath Referring Physician: Dr. Benjie Karvonen, [facility] family medicine Heather [name] is an 82 y.o. female.  HPI: 82 year old white female history of anxiety GERD hypertension (mild obesity previous history of smoking status done reasonably well but presented with significant dyspnea shortness of breath. Patient had been developing dyspnea on exertion for several weeks recently started having cough congestion and fever with chills weakness fatigue and dyspnea. Patient went to urgent care because of wheezing and congestion she was given inhalers but also was found to have rapid atrial fibrillation. Patient then transferred to the emergency room for further assessment and possible admission for new onset A. fib with dyspnea possible pneumonia. Patient denies much in the left chest pain but this had pressure with shortness of breath had minimal palpitations or tachycardia. Denies any significant leg swelling no blackout spells or syncope no previous cardiac history does not like to take medications.  Past Medical History:  Diagnosis Date  . Anxiety   . GERD (gastroesophageal reflux disease)   . Hypertension   . Thyroid disease     Past Surgical History:  Procedure Laterality Date  . ABDOMINAL HYSTERECTOMY    . APPENDECTOMY      Family History  Problem Relation Age of Onset  . Family history unknown: Yes    Social History:  reports that she has quit smoking. She has never used smokeless tobacco. She reports that she does not drink alcohol or use drugs.  Allergies: No Known Allergies  Medications: I have reviewed the patient's current medications.  Results for orders placed or performed during the hospital encounter of 06/28/16 (from the past 48 hour(s))  Troponin I     Status: Abnormal   Collection Time: 06/28/16  6:43 PM  Result Value Ref Range   Troponin I 0.03 (HH) <0.03 ng/mL    Comment: CRITICAL RESULT CALLED TO, READ  BACK BY AND VERIFIED WITH Heather. MALINDA AT 2038 06/28/2016 BY TFK.   Comprehensive metabolic panel     Status: Abnormal   Collection Time: 06/28/16  6:43 PM  Result Value Ref Range   Sodium 133 (L) 135 - 145 mmol/L   Potassium 3.2 (L) 3.5 - 5.1 mmol/L   Chloride 98 (L) 101 - 111 mmol/L   CO2 24 22 - 32 mmol/L   Glucose, Bld 127 (H) 65 - 99 mg/dL   BUN 18 6 - 20 mg/dL   Creatinine, Ser 0.78 0.44 - 1.00 mg/dL   Calcium 9.1 8.9 - 10.3 mg/dL   Total Protein 8.3 (H) 6.5 - 8.1 g/dL   Albumin 4.3 3.5 - 5.0 g/dL   AST 36 15 - 41 U/L   ALT 21 14 - 54 U/L   Alkaline Phosphatase 71 38 - 126 U/L   Total Bilirubin 0.1 (L) 0.3 - 1.2 mg/dL   GFR calc non Af Amer >60 >60 mL/min   GFR calc Af Amer >60 >60 mL/min    Comment: (NOTE) The eGFR has been calculated using the CKD EPI equation. This calculation has not been validated in all clinical situations. eGFR's persistently <60 mL/min signify possible Chronic Kidney Disease.    Anion gap 11 5 - 15  Brain natriuretic peptide     Status: Abnormal   Collection Time: 06/28/16  6:43 PM  Result Value Ref Range   B Natriuretic Peptide 174.0 (H) 0.0 - 100.0 pg/mL  CBC with Differential     Status: None   Collection Time: 06/28/16  6:43 PM  Result Value Ref Range   WBC 7.1 3.6 - 11.0 K/uL   RBC 4.64 3.80 - 5.20 MIL/uL   Hemoglobin 14.7 12.0 - 16.0 g/dL   HCT 43.0 35.0 - 47.0 %   MCV 92.5 80.0 - 100.0 fL   MCH 31.6 26.0 - 34.0 pg   MCHC 34.1 32.0 - 36.0 g/dL   RDW 12.6 11.5 - 14.5 %   Platelets 203 150 - 440 K/uL   Neutrophils Relative % 80 %   Neutro Abs 5.7 1.4 - 6.5 K/uL   Lymphocytes Relative 13 %   Lymphs Abs 1.0 1.0 - 3.6 K/uL   Monocytes Relative 6 %   Monocytes Absolute 0.4 0.2 - 0.9 K/uL   Eosinophils Relative 0 %   Eosinophils Absolute 0.0 0 - 0.7 K/uL   Basophils Relative 1 %   Basophils Absolute 0.1 0 - 0.1 K/uL  Fibrin derivatives D-Dimer     Status: Abnormal   Collection Time: 06/28/16  6:43 PM  Result Value Ref Range    Fibrin derivatives D-dimer ([facility]) 1,063 (H) 0 - 499    Comment: <> Exclusion of Venous Thromboembolism (VTE) - OUTPATIENTS ONLY        (Emergency Department or [place])             0-499 ng/ml (FEU)  : With a low to intermediate pretest                                        probability for VTE this test result                                        excludes the diagnosis of VTE.           > 499 ng/ml (FEU)  : VTE not excluded.  Additional work up                                   for VTE is required.   <>  Testing on Inpatients and Evaluation of Disseminated Intravascular        Coagulation (DIC)             Reference Range:   0-499 ng/ml (FEU)   TSH     Status: None   Collection Time: 06/28/16  6:43 PM  Result Value Ref Range   TSH 1.138 0.350 - 4.500 uIU/mL    Comment: Performed by a 3rd Generation assay with a functional sensitivity of <=0.01 uIU/mL.  Urinalysis, Routine w reflex microscopic     Status: Abnormal   Collection Time: 06/28/16  8:57 PM  Result Value Ref Range   Color, Urine STRAW (A) YELLOW   APPearance CLEAR (A) CLEAR   Specific Gravity, Urine 1.006 1.005 - 1.030   pH 6.0 5.0 - 8.0   Glucose, UA NEGATIVE NEGATIVE mg/dL   Hgb urine dipstick NEGATIVE NEGATIVE   Bilirubin Urine NEGATIVE NEGATIVE   Ketones, ur NEGATIVE NEGATIVE mg/dL   Protein, ur NEGATIVE NEGATIVE mg/dL   Nitrite NEGATIVE NEGATIVE   Leukocytes, UA NEGATIVE NEGATIVE  Blood culture (routine x 2)     Status: None (Preliminary result)   Collection Time: 06/28/16  9:58 PM  Result Value Ref Range  Specimen Description BLOOD  L AC    Special Requests      BOTTLES DRAWN AEROBIC AND ANAEROBIC  AER 4 ML ANA 5 ML   Culture NO GROWTH < 12 HOURS    Report Status PENDING   Blood culture (routine x 2)     Status: None (Preliminary result)   Collection Time: 06/28/16  9:58 PM  Result Value Ref Range   Specimen Description BLOOD  R ARM    Special Requests      BOTTLES DRAWN AEROBIC AND ANAEROBIC  AER 6 ML  ANA 5 ML   Culture NO GROWTH < 12 HOURS    Report Status PENDING   Troponin I     Status: Abnormal   Collection Time: 06/29/16  1:38 AM  Result Value Ref Range   Troponin I 0.04 (HH) <0.03 ng/mL    Comment: CRITICAL VALUE NOTED. VALUE IS CONSISTENT WITH PREVIOUSLY REPORTED/CALLED VALUE [facility]  Basic metabolic panel     Status: Abnormal   Collection Time: 06/29/16  1:38 AM  Result Value Ref Range   Sodium 134 (L) 135 - 145 mmol/L   Potassium 3.2 (L) 3.5 - 5.1 mmol/L   Chloride 100 (L) 101 - 111 mmol/L   CO2 26 22 - 32 mmol/L   Glucose, Bld 98 65 - 99 mg/dL   BUN 15 6 - 20 mg/dL   Creatinine, Ser 0.79 0.44 - 1.00 mg/dL   Calcium 8.8 (L) 8.9 - 10.3 mg/dL   GFR calc non Af Amer >60 >60 mL/min   GFR calc Af Amer >60 >60 mL/min    Comment: (NOTE) The eGFR has been calculated using the CKD EPI equation. This calculation has not been validated in all clinical situations. eGFR's persistently <60 mL/min signify possible Chronic Kidney Disease.    Anion gap 8 5 - 15  CBC     Status: None   Collection Time: 06/29/16  1:38 AM  Result Value Ref Range   WBC 6.8 3.6 - 11.0 K/uL   RBC 4.58 3.80 - 5.20 MIL/uL   Hemoglobin 14.1 12.0 - 16.0 g/dL   HCT 41.4 35.0 - 47.0 %   MCV 90.2 80.0 - 100.0 fL   MCH 30.7 26.0 - 34.0 pg   MCHC 34.0 32.0 - 36.0 g/dL   RDW 12.7 11.5 - 14.5 %   Platelets 208 150 - 440 K/uL  Troponin I     Status: Abnormal   Collection Time: 06/29/16  7:32 AM  Result Value Ref Range   Troponin I 0.05 (HH) <0.03 ng/mL    Comment: CRITICAL VALUE NOTED. VALUE IS CONSISTENT WITH PREVIOUSLY REPORTED/CALLED VALUE DAS    Dg Chest 2 View  Result Date: 06/28/2016 CLINICAL DATA:  Cough and shortness of breath for 1 week. EXAM: CHEST  2 VIEW COMPARISON:  None. FINDINGS: There is cardiomegaly without edema. The lungs are clear. No pneumothorax or pleural effusion. Aortic atherosclerosis is identified. Scoliosis is noted. Advanced degenerative changes seen about the left glenohumeral  joint. IMPRESSION: Cardiomegaly without acute disease. Atherosclerosis. Electronically Signed   By: Inge Rise M.D.   On: 06/28/2016 16:45   Ct Angio Chest Pe W And/or Wo Contrast  Result Date: 06/28/2016 CLINICAL DATA:  Cough and cold, shortness of breath with elevated D-dimer EXAM: CT ANGIOGRAPHY CHEST WITH CONTRAST TECHNIQUE: Multidetector CT imaging of the chest was performed using the standard protocol during bolus administration of intravenous contrast. Multiplanar CT image reconstructions and MIPs were obtained to evaluate the vascular anatomy.  CONTRAST:  75 mL Isovue 370 intravenous COMPARISON:  Chest x-ray 06/28/2016 FINDINGS: Cardiovascular: Satisfactory opacification of the pulmonary arteries to the segmental level. No evidence of pulmonary embolism. There is cardiomegaly with multi chamber enlargement. No large pericardial effusion. There are coronary artery calcifications. There is atherosclerosis of the aorta. Ascending segment is nonenlarged. Mild ectasia of the distal arch and proximal descending thoracic aorta. Mediastinum/Nodes: No significantly enlarged mediastinal or hilar lymph nodes. Thyroid is unremarkable. Trachea is midline. Esophagus grossly unremarkable. Lungs/Pleura: No pleural effusion or pneumothorax is visualized. Scattered ground-glass densities/mild mosaic pattern. Mild centrilobular nodular densities within the left lower lobe, suggestive of respiratory infection, to include atypical organisms. Upper Abdomen: Partially visualized calcified stones in the gallbladder. No acute abnormalities. Musculoskeletal: Degenerative changes of the spine. No acute or suspicious bone lesions. Review of the MIP images confirms the above findings. IMPRESSION: 1. No CT evidence for acute pulmonary embolus. 2. Cardiomegaly. 3. Scattered bilateral areas of ground-glass density/mild mosaic pattern, could relate to small airways disease or possible edema. Mild centrilobular nodular densities in  the left lower lobe would be compatible with respiratory infection, to include atypical organisms. 4. Calcified gallstones Electronically Signed   By: Donavan Foil M.D.   On: 06/28/2016 21:19    Review of Systems  Constitutional: Positive for chills, diaphoresis, fever and malaise/fatigue.  HENT: Positive for congestion.   Eyes: Negative.   Respiratory: Positive for cough, sputum production, shortness of breath and wheezing.   Cardiovascular: Positive for palpitations, orthopnea and PND.  Gastrointestinal: Negative.   Genitourinary: Negative.   Musculoskeletal: Negative.   Skin: Negative.   Neurological: Positive for weakness.  Endo/Heme/Allergies: Negative.   Psychiatric/Behavioral: Negative.    Blood pressure (!) 149/117, pulse (!) 53, temperature 98.2 F (36.8 C), temperature source Oral, resp. rate 19, height _0  (1.651 m), weight 92.9 kg (204 lb 12.8 oz), SpO2 97 %. Physical Exam  Nursing note and vitals reviewed. Constitutional: She appears well-developed.  HENT:  Head: Normocephalic and atraumatic.  Eyes: Conjunctivae and EOM are normal. Pupils are equal, round, and reactive to light.  Neck: Normal range of motion.  Cardiovascular: Intact distal pulses and normal pulses.  An irregularly irregular rhythm present.  Occasional extrasystoles are present. Tachycardia present.   Murmur heard.  Systolic murmur is present with a grade of 2/6    Assessment/Plan: Shortness of breath Atrial fibrillation rapid ventricular response Community-acquired pneumonia Hypertension Hyperlipidemia GERD Obesity Thyroid disease Hypokalemia . Plan Agree with admit to telemetry rule out for myocardial infarction Follow-up cardiac enzymes and EKG for A. Fib Recommend rate control for atrial fibrillation with diltiazem Short-term anticoagulation with Lovenox Nitroglycerin we will continue long-term anticoagulation for this A. fib with a low chads score Thyroid disease continue Synthroid  therapy Hypertension patient's been maintained on losartan was on amlodipine butto diltiazem Broad-spectrum antibiotic therapy for community-acquired pneumonia with Rocephin and Zithromax Continue inhaler therapy for shortness of breath and wheezing Mild obesity recommend weight loss exercise portion control Recommend echocardiogram for assessment of left ventricular function and valvular disease Consider functional study related to the patient's recovered to rule out coronary disease Possible COPD with a long history of smoking but quit in the distant past , significant secondhand smoke Consider low-dose diuretic therapy for shortness of breath dyspnea Supplemental oxygen to help with symptomatic dyspnea Correct electrolytes replace potassium We will defer invasive studies like cardiac cath at this point  Sherea Liptak D Trevelle Mcgurn 06/29/2016, 2:15 PM

## 2016-06-29 NOTE — Progress Notes (Signed)
Arrival Method: via stretcher with ED NT & daughter Mental Orientation: A&O Telemetry: [id number], verified by Florina OuJanice H, NT Skin: intact, verified by Salli QuarryAdrienne White, RN IV: 20g left AC Pain: none Tubes: n/a Safety Measures: Safety Fall Prevention Plan has been given, discussed & signed, non skid socks in place. 2A Orientation: Patient has been orientated to the room, unit & staff.  Family: Has been informed of plan of care.  Orders have been reviewed & implemented. Will continue to monitor the patient. Call light has been placed within reach.  Eden LatheLexi Miller, RN

## 2016-06-29 NOTE — Progress Notes (Signed)
*  PRELIMINARY RESULTS* Echocardiogram 2D Echocardiogram has been performed.  Cristela BlueHege, Ellieanna Funderburg 06/29/2016, 2:15 PM

## 2016-06-29 NOTE — Progress Notes (Addendum)
[facility] - [place] at [facility]   PATIENT NAME: Heather [name]    MR#:  [id number]  DATE OF BIRTH:  05/05/1932  SUBJECTIVE:   Patient here with pneumonia and new onset atrial fibrillation  REVIEW OF SYSTEMS:    Review of Systems  Constitutional: Negative.  Negative for chills, fever and malaise/fatigue.  HENT: Negative.  Negative for ear discharge, ear pain, hearing loss, nosebleeds and sore throat.   Eyes: Negative.  Negative for blurred vision and pain.  Respiratory: Positive for cough, sputum production and shortness of breath. Negative for hemoptysis and wheezing.   Cardiovascular: Negative.  Negative for chest pain, palpitations and leg swelling.  Gastrointestinal: Negative.  Negative for abdominal pain, blood in stool, diarrhea, nausea and vomiting.  Genitourinary: Negative.  Negative for dysuria.  Musculoskeletal: Negative.  Negative for back pain.  Skin: Negative.   Neurological: Negative for dizziness, tremors, speech change, focal weakness, seizures and headaches.  Endo/Heme/Allergies: Negative.  Does not bruise/bleed easily.  Psychiatric/Behavioral: Negative.  Negative for depression, hallucinations and suicidal ideas.    Tolerating Diet: yes      DRUG ALLERGIES:  No Known Allergies  VITALS:  Blood pressure (!) 149/117, pulse (!) 53, temperature 98.2 F (36.8 C), temperature source Oral, resp. rate 19, height 5\' 5"  (1.651 m), weight 92.9 kg (204 lb 12.8 oz), SpO2 97 %.  PHYSICAL EXAMINATION:   Physical Exam  Constitutional: She is oriented to person, place, and time and well-developed, well-nourished, and in no distress. No distress.  HENT:  Head: Normocephalic.  Eyes: No scleral icterus.  Neck: Normal range of motion. Neck supple. No JVD present. No tracheal deviation present.  Cardiovascular: Normal rate, regular rhythm and normal heart sounds.  Exam reveals no gallop and no friction rub.   No murmur heard. Pulmonary/Chest: Effort  normal. No respiratory distress. She has no wheezes. She has no rales. She exhibits no tenderness.  Decreased throughout  Abdominal: Soft. Bowel sounds are normal. She exhibits no distension and no mass. There is no tenderness. There is no rebound and no guarding.  Musculoskeletal: Normal range of motion. She exhibits no edema.  Neurological: She is alert and oriented to person, place, and time.  Skin: Skin is warm. No rash noted. No erythema.  Psychiatric: Affect and judgment normal.      LABORATORY PANEL:   CBC  Recent Labs Lab 06/29/16 0138  WBC 6.8  HGB 14.1  HCT 41.4  PLT 208   ------------------------------------------------------------------------------------------------------------------  Chemistries   Recent Labs Lab 06/28/16 1843 06/29/16 0138  NA 133* 134*  K 3.2* 3.2*  CL 98* 100*  CO2 24 26  GLUCOSE 127* 98  BUN 18 15  CREATININE 0.78 0.79  CALCIUM 9.1 8.8*  AST 36  --   ALT 21  --   ALKPHOS 71  --   BILITOT 0.1*  --    ------------------------------------------------------------------------------------------------------------------  Cardiac Enzymes  Recent Labs Lab 06/28/16 1843 06/29/16 0138 06/29/16 0732  TROPONINI 0.03* 0.04* 0.05*   ------------------------------------------------------------------------------------------------------------------  RADIOLOGY:  Dg Chest 2 View  Result Date: 06/28/2016 CLINICAL DATA:  Cough and shortness of breath for 1 week. EXAM: CHEST  2 VIEW COMPARISON:  None. FINDINGS: There is cardiomegaly without edema. The lungs are clear. No pneumothorax or pleural effusion. Aortic atherosclerosis is identified. Scoliosis is noted. Advanced degenerative changes seen about the left glenohumeral joint. IMPRESSION: Cardiomegaly without acute disease. Atherosclerosis. Electronically Signed   By: Drusilla Kannerhomas  Dalessio M.D.   On: 06/28/2016 16:45   Ct  Angio Chest Pe W And/or Wo Contrast  Result Date: 06/28/2016 CLINICAL  DATA:  Cough and cold, shortness of breath with elevated D-dimer EXAM: CT ANGIOGRAPHY CHEST WITH CONTRAST TECHNIQUE: Multidetector CT imaging of the chest was performed using the standard protocol during bolus administration of intravenous contrast. Multiplanar CT image reconstructions and MIPs were obtained to evaluate the vascular anatomy. CONTRAST:  75 mL Isovue 370 intravenous COMPARISON:  Chest x-ray 06/28/2016 FINDINGS: Cardiovascular: Satisfactory opacification of the pulmonary arteries to the segmental level. No evidence of pulmonary embolism. There is cardiomegaly with multi chamber enlargement. No large pericardial effusion. There are coronary artery calcifications. There is atherosclerosis of the aorta. Ascending segment is nonenlarged. Mild ectasia of the distal arch and proximal descending thoracic aorta. Mediastinum/Nodes: No significantly enlarged mediastinal or hilar lymph nodes. Thyroid is unremarkable. Trachea is midline. Esophagus grossly unremarkable. Lungs/Pleura: No pleural effusion or pneumothorax is visualized. Scattered ground-glass densities/mild mosaic pattern. Mild centrilobular nodular densities within the left lower lobe, suggestive of respiratory infection, to include atypical organisms. Upper Abdomen: Partially visualized calcified stones in the gallbladder. No acute abnormalities. Musculoskeletal: Degenerative changes of the spine. No acute or suspicious bone lesions. Review of the MIP images confirms the above findings. IMPRESSION: 1. No CT evidence for acute pulmonary embolus. 2. Cardiomegaly. 3. Scattered bilateral areas of ground-glass density/mild mosaic pattern, could relate to small airways disease or possible edema. Mild centrilobular nodular densities in the left lower lobe would be compatible with respiratory infection, to include atypical organisms. 4. Calcified gallstones Electronically Signed   By: Jasmine PangKim  Fujinaga M.D.   On: 06/28/2016 21:19     ASSESSMENT AND PLAN:    82 year old female with a history of essential hypertension who presents with shortness of breath found to have pneumonia and new onset atrial fibrillation.  1. Community acquired pneumonia:  Continue Rocephin and azithromycin  2. New onset atrial fibrillation: Heart rate is controlled. Start Diltiazem 120 mg daily Follow up on echocardiogram and cardiology consult   3. Hypothyroidism on Synthroid TSH 1.1 which is within normal limits  4. Essential hypertension: Continue diltiazem and losartan Discontinue Norvasc 5. History of depression on Zoloft  6. Hypokalemia: Potassium repleted this a.m. and repeat in a.m.   Management plans discussed with the patient and she is in agreement.  CODE STATUS: FULL  TOTAL TIME TAKING CARE OF THIS PATIENT: 30 minutes.     POSSIBLE D/C tomorrow, DEPENDING ON CLINICAL CONDITION.   Leonor Darnell M.D on 06/29/2016 at 12:15 PM  Between 7am to 6pm - Pager - [phone] After 6pm go to [email or web address] - password [email or web address]  [facility]  Office  [phone]  CC: Primary care physician; Selina CooleyAXELBANK,ARTHUR, MD  Note: This dictation was prepared with Dragon dictation along with smaller phrase technology. Any transcriptional errors that result from this process are unintentional.

## 2016-06-29 NOTE — Progress Notes (Signed)
PHARMACIST - PHYSICIAN COMMUNICATION DR:   Juliene PinaMody CONCERNING: Antibiotic IV to Oral Route Change Policy  RECOMMENDATION: This patient is receiving azithromycin by the intravenous route.  Based on criteria approved by the Pharmacy and Therapeutics Committee, the antibiotic(s) is/are being converted to the equivalent oral dose form(s).   DESCRIPTION: These criteria include:  Patient being treated for a respiratory tract infection, urinary tract infection, cellulitis or clostridium difficile associated diarrhea if on metronidazole  The patient is not neutropenic and does not exhibit a GI malabsorption state  The patient is eating (either orally or via tube) and/or has been taking other orally administered medications for a least 24 hours  The patient is improving clinically and has a Tmax < 100.5  If you have questions about this conversion, please contact the Pharmacy Department   Cindi CarbonMary M Evangaline Jou, PharmD 06/29/16 10:00 AM

## 2016-06-29 NOTE — Progress Notes (Signed)
ANTIBIOTIC CONSULT NOTE - INITIAL  Pharmacy Consult for Ceftriaxone  Indication: pneumonia  No Known Allergies  Patient Measurements: Height: 5\' 5"  (165.1 cm) Weight: 204 lb 12.8 oz (92.9 kg) IBW/kg (Calculated) : 57 Adjusted Body Weight:   Vital Signs: Temp: 97.4 F (36.3 C) (12/14 0106) Temp Source: Oral (12/14 0106) BP: 138/84 (12/14 0106) Pulse Rate: 92 (12/14 0106) Intake/Output from previous day: 12/13 0701 - 12/14 0700 In: 250 [IV Piggyback:250] Out: -  Intake/Output from this shift: Total I/O In: 250 [IV Piggyback:250] Out: -   Labs:  Recent Labs  06/28/16 1843  WBC 7.1  HGB 14.7  PLT 203  CREATININE 0.78   Estimated Creatinine Clearance: 61.1 mL/min (by C-G formula based on SCr of 0.78 mg/dL). No results for input(s): VANCOTROUGH, VANCOPEAK, VANCORANDOM, GENTTROUGH, GENTPEAK, GENTRANDOM, TOBRATROUGH, TOBRAPEAK, TOBRARND, AMIKACINPEAK, AMIKACINTROU, AMIKACIN in the last 72 hours.   Microbiology: No results found for this or any previous visit (from the past 720 hour(s)).  Medical History: Past Medical History:  Diagnosis Date  . Anxiety   . GERD (gastroesophageal reflux disease)   . Hypertension   . Thyroid disease     Medications:  Prescriptions Prior to Admission  Medication Sig Dispense Refill Last Dose  . amLODipine (NORVASC) 2.5 MG tablet Take 2.5 mg by mouth 2 (two) times daily.    unknown at unknown  . levothyroxine (SYNTHROID, LEVOTHROID) 50 MCG tablet Take 50 mcg by mouth daily before breakfast.   unknown at unknown  . losartan (COZAAR) 25 MG tablet Take 25 mg by mouth 2 (two) times daily.    unknown at unknown  . sertraline (ZOLOFT) 25 MG tablet Take 25 mg by mouth daily. Take 1 tablet in morning and 1/2 tablet every evening.   unknown at unknown  . albuterol (PROVENTIL) (2.5 MG/3ML) 0.083% nebulizer solution Take 2.5 mg by nebulization every 6 (six) hours as needed for wheezing or shortness of breath.   prn at prn   Assessment: CrCl =  61.1 ml/min  Goal of Therapy:  resolution of infection  Plan:  Expected duration 7 days with resolution of temperature and/or normalization of WBC  Ceftriaxone 1 gm IV X 1 given in ED on 12/13 @ 22:00.  Will order Ceftriaxone 1 gm IV Q24H to start 12/14 @ 18:00.   Calyn Rubi D 06/29/2016,1:26 AM

## 2016-06-30 ENCOUNTER — Inpatient Hospital Stay: Payer: Medicare Other

## 2016-06-30 DIAGNOSIS — R0902 Hypoxemia: Secondary | ICD-10-CM | POA: Diagnosis not present

## 2016-06-30 DIAGNOSIS — J189 Pneumonia, unspecified organism: Secondary | ICD-10-CM | POA: Diagnosis not present

## 2016-06-30 LAB — BASIC METABOLIC PANEL
Anion gap: 6 (ref 5–15)
BUN: 16 mg/dL (ref 6–20)
CO2: 26 mmol/L (ref 22–32)
Calcium: 8.7 mg/dL — ABNORMAL LOW (ref 8.9–10.3)
Chloride: 102 mmol/L (ref 101–111)
Creatinine, Ser: 0.73 mg/dL (ref 0.44–1.00)
GFR calc Af Amer: >60 mL/min (ref >60)
GFR calc non Af Amer: >60 mL/min (ref >60)
Glucose, Bld: 114 mg/dL — ABNORMAL HIGH (ref 65–99)
Potassium: 3.9 mmol/L (ref 3.5–5.1)
Sodium: 134 mmol/L — ABNORMAL LOW (ref 135–145)

## 2016-06-30 MED ORDER — FUROSEMIDE 10 MG/ML IJ SOLN
20.0000 mg | Freq: Two times a day (BID) | INTRAMUSCULAR | Status: DC
  Administered 2016-06-30 – 2016-07-02 (×5): 20 mg via INTRAVENOUS
  Filled 2016-06-30 (×5): qty 2

## 2016-06-30 MED ORDER — POTASSIUM CHLORIDE 20 MEQ/15ML (10%) PO SOLN
40.0000 meq | Freq: Once | ORAL | Status: AC
Start: 2016-06-30 — End: 2016-06-30
  Administered 2016-06-30: 40 meq via ORAL
  Filled 2016-06-30: qty 30

## 2016-06-30 NOTE — Progress Notes (Signed)
ANTIBIOTIC CONSULT NOTE - FOLLOW UP   Pharmacy Consult for Ceftriaxone  Indication: pneumonia  No Known Allergies  Patient Measurements: Height: 5\' 5"  (165.1 cm) Weight: 204 lb 12.8 oz (92.9 kg) IBW/kg (Calculated) : 57 Adjusted Body Weight:   Vital Signs: Temp: 97.7 F (36.5 C) (12/15 0547) Temp Source: Oral (12/15 0547) BP: 135/80 (12/15 0847) Pulse Rate: 87 (12/15 0847) Intake/Output from previous day: 12/14 0701 - 12/15 0700 In: 480 [P.O.:480] Out: 525 [Urine:525] Intake/Output from this shift: Total I/O In: 360 [P.O.:360] Out: -   Labs:  Recent Labs  06/28/16 1843 06/29/16 0138 06/30/16 0448  WBC 7.1 6.8  --   HGB 14.7 14.1  --   PLT 203 208  --   CREATININE 0.78 0.79 0.73   Estimated Creatinine Clearance: 61.1 mL/min (by C-G formula based on SCr of 0.73 mg/dL). No results for input(s): VANCOTROUGH, VANCOPEAK, VANCORANDOM, GENTTROUGH, GENTPEAK, GENTRANDOM, TOBRATROUGH, TOBRAPEAK, TOBRARND, AMIKACINPEAK, AMIKACINTROU, AMIKACIN in the last 72 hours.   Microbiology: Recent Results (from the past 720 hour(s))  Blood culture (routine x 2)     Status: None (Preliminary result)   Collection Time: 06/28/16  9:58 PM  Result Value Ref Range Status   Specimen Description BLOOD  L AC  Final   Special Requests   Final    BOTTLES DRAWN AEROBIC AND ANAEROBIC  AER 4 ML ANA 5 ML   Culture NO GROWTH < 12 HOURS  Final   Report Status PENDING  Incomplete  Blood culture (routine x 2)     Status: None (Preliminary result)   Collection Time: 06/28/16  9:58 PM  Result Value Ref Range Status   Specimen Description BLOOD  R ARM  Final   Special Requests   Final    BOTTLES DRAWN AEROBIC AND ANAEROBIC  AER 6 ML ANA 5 ML   Culture NO GROWTH < 12 HOURS  Final   Report Status PENDING  Incomplete    Medical History: Past Medical History:  Diagnosis Date  . Anxiety   . GERD (gastroesophageal reflux disease)   . Hypertension   . Thyroid disease     Medications:   Prescriptions Prior to Admission  Medication Sig Dispense Refill Last Dose  . amLODipine (NORVASC) 2.5 MG tablet Take 2.5 mg by mouth 2 (two) times daily.    unknown at unknown  . levothyroxine (SYNTHROID, LEVOTHROID) 50 MCG tablet Take 50 mcg by mouth daily before breakfast.   unknown at unknown  . losartan (COZAAR) 25 MG tablet Take 25 mg by mouth 2 (two) times daily.    unknown at unknown  . sertraline (ZOLOFT) 25 MG tablet Take 25 mg by mouth daily. Take 1 tablet in morning and 1/2 tablet every evening.   unknown at unknown  . albuterol (PROVENTIL) (2.5 MG/3ML) 0.083% nebulizer solution Take 2.5 mg by nebulization every 6 (six) hours as needed for wheezing or shortness of breath.   prn at prn   Assessment: CrCl = 61.1 ml/min  Goal of Therapy:  resolution of infection  Plan:  Expected duration 7 days with resolution of temperature and/or normalization of WBC  Continue Rocephin 1 g IV q24 hours.   Rossetta Kama D 06/30/2016,10:56 AM

## 2016-06-30 NOTE — Care Management (Signed)
Patient admitted with pneumonia and new onset of atrial fib. . Independent in all adls, denies issues accessing medical care, obtaining medications or with transportation.  Current with her PCP.

## 2016-06-30 NOTE — Progress Notes (Signed)
[facility] - [place] at [facility]   PATIENT NAME: Heather [name]    MR#:  [id number]  DATE OF BIRTH:  Nov 29, 1931  SUBJECTIVE:   Patient with wheezing this am +orthopnea  REVIEW OF SYSTEMS:    Review of Systems  Constitutional: Negative.  Negative for chills, fever and malaise/fatigue.  HENT: Negative.  Negative for ear discharge, ear pain, hearing loss, nosebleeds and sore throat.   Eyes: Negative.  Negative for blurred vision and pain.  Respiratory: Positive for cough, sputum production, shortness of breath and wheezing. Negative for hemoptysis.   Cardiovascular: Negative.  Negative for chest pain, palpitations and leg swelling.  Gastrointestinal: Negative.  Negative for abdominal pain, blood in stool, diarrhea, nausea and vomiting.  Genitourinary: Negative.  Negative for dysuria.  Musculoskeletal: Negative.  Negative for back pain.  Skin: Negative.   Neurological: Negative for dizziness, tremors, speech change, focal weakness, seizures and headaches.  Endo/Heme/Allergies: Negative.  Does not bruise/bleed easily.  Psychiatric/Behavioral: Negative.  Negative for depression, hallucinations and suicidal ideas.    Tolerating Diet: yes      DRUG ALLERGIES:  No Known Allergies  VITALS:  Blood pressure 135/80, pulse 87, temperature 97.7 F (36.5 C), temperature source Oral, resp. rate 20, height 5\' 5"  (1.651 m), weight 92.9 kg (204 lb 12.8 oz), SpO2 96 %.  PHYSICAL EXAMINATION:   Physical Exam  Constitutional: She is oriented to person, place, and time and well-developed, well-nourished, and in no distress. No distress.  HENT:  Head: Normocephalic.  Eyes: No scleral icterus.  Neck: Normal range of motion. Neck supple. No JVD present. No tracheal deviation present.  Cardiovascular: Normal rate, regular rhythm and normal heart sounds.  Exam reveals no gallop and no friction rub.   No murmur heard. Pulmonary/Chest: Effort normal. No respiratory distress. She  has wheezes. She has no rales. She exhibits no tenderness.  Abdominal: Soft. Bowel sounds are normal. She exhibits no distension and no mass. There is no tenderness. There is no rebound and no guarding.  Musculoskeletal: Normal range of motion. She exhibits no edema.  Neurological: She is alert and oriented to person, place, and time.  Skin: Skin is warm. No rash noted. No erythema.  Psychiatric: Affect and judgment normal.      LABORATORY PANEL:   CBC  Recent Labs Lab 06/29/16 0138  WBC 6.8  HGB 14.1  HCT 41.4  PLT 208   ------------------------------------------------------------------------------------------------------------------  Chemistries   Recent Labs Lab 06/28/16 1843  06/30/16 0448  NA 133*  < > 134*  K 3.2*  < > 3.9  CL 98*  < > 102  CO2 24  < > 26  GLUCOSE 127*  < > 114*  BUN 18  < > 16  CREATININE 0.78  < > 0.73  CALCIUM 9.1  < > 8.7*  AST 36  --   --   ALT 21  --   --   ALKPHOS 71  --   --   BILITOT 0.1*  --   --   < > = values in this interval not displayed. ------------------------------------------------------------------------------------------------------------------  Cardiac Enzymes  Recent Labs Lab 06/29/16 0138 06/29/16 0732 06/29/16 1329  TROPONINI 0.04* 0.05* 0.03*   ------------------------------------------------------------------------------------------------------------------  RADIOLOGY:  Dg Chest 1 View  Result Date: 06/30/2016 CLINICAL DATA:  pneumonia EXAM: CHEST 1 VIEW COMPARISON:  CHEST RADIOGRAPH 06/28/2016 FINDINGS: Normal mediastinum and cardiac silhouette. Chronic central bronchitic markings. Normal pulmonary vasculature. No effusion, infiltrate, or pneumothorax. IMPRESSION: No evidence pneumonia. Electronically Signed  By: Genevive BiStewart  Edmunds M.D.   On: 06/30/2016 09:57   Dg Chest 2 View  Result Date: 06/28/2016 CLINICAL DATA:  Cough and shortness of breath for 1 week. EXAM: CHEST  2 VIEW COMPARISON:  None.  FINDINGS: There is cardiomegaly without edema. The lungs are clear. No pneumothorax or pleural effusion. Aortic atherosclerosis is identified. Scoliosis is noted. Advanced degenerative changes seen about the left glenohumeral joint. IMPRESSION: Cardiomegaly without acute disease. Atherosclerosis. Electronically Signed   By: Drusilla Kannerhomas  Dalessio M.D.   On: 06/28/2016 16:45   Ct Angio Chest Pe W And/or Wo Contrast  Result Date: 06/28/2016 CLINICAL DATA:  Cough and cold, shortness of breath with elevated D-dimer EXAM: CT ANGIOGRAPHY CHEST WITH CONTRAST TECHNIQUE: Multidetector CT imaging of the chest was performed using the standard protocol during bolus administration of intravenous contrast. Multiplanar CT image reconstructions and MIPs were obtained to evaluate the vascular anatomy. CONTRAST:  75 mL Isovue 370 intravenous COMPARISON:  Chest x-ray 06/28/2016 FINDINGS: Cardiovascular: Satisfactory opacification of the pulmonary arteries to the segmental level. No evidence of pulmonary embolism. There is cardiomegaly with multi chamber enlargement. No large pericardial effusion. There are coronary artery calcifications. There is atherosclerosis of the aorta. Ascending segment is nonenlarged. Mild ectasia of the distal arch and proximal descending thoracic aorta. Mediastinum/Nodes: No significantly enlarged mediastinal or hilar lymph nodes. Thyroid is unremarkable. Trachea is midline. Esophagus grossly unremarkable. Lungs/Pleura: No pleural effusion or pneumothorax is visualized. Scattered ground-glass densities/mild mosaic pattern. Mild centrilobular nodular densities within the left lower lobe, suggestive of respiratory infection, to include atypical organisms. Upper Abdomen: Partially visualized calcified stones in the gallbladder. No acute abnormalities. Musculoskeletal: Degenerative changes of the spine. No acute or suspicious bone lesions. Review of the MIP images confirms the above findings. IMPRESSION: 1. No CT  evidence for acute pulmonary embolus. 2. Cardiomegaly. 3. Scattered bilateral areas of ground-glass density/mild mosaic pattern, could relate to small airways disease or possible edema. Mild centrilobular nodular densities in the left lower lobe would be compatible with respiratory infection, to include atypical organisms. 4. Calcified gallstones Electronically Signed   By: Jasmine PangKim  Fujinaga M.D.   On: 06/28/2016 21:19     ASSESSMENT AND PLAN:   82 year old female with a history of essential hypertension who presents with shortness of breath found to have pneumonia and new onset atrial fibrillation.  1. Community acquired pneumonia seen on CT scan: Chest x-ray does not show community-acquired pneumonia, but was seen on CT scan on admission I would continue with Rocephin and azithromycin through Sunday.  2. New onset atrial fibrillation: Heart rate is controlled. Continue Diltiazem 120 mg daily Echocardiogram shows EF of 45% with moderate MR    3. Acute hypoxic respiratory failure in the setting of community-acquired pneumonia and acute systolic heart failure: Continue antibiotics as above   wean oxygen as tolerated Start low-dose Lasix 20 IV every 12 BMP for a.m. Consider changing diltiazem to beta blocker at discharge once heart failure has resolved Patient will need low-dose Lasix at discharge  4. Hypothyroidism on Synthroid TSH 1.1 which is within normal limits  5. Essential hypertension: Continue diltiazem and losartan Discontinue Norvasc 6. History of depression on Zoloft  7. Hypokalemia: Improved with repletion   8. Acute systolic heart failure: Plan as outlined above. Patient would benefit from outpatient cardiac stress test once she acute issues have resolved  Management plans discussed with the patient and she is in agreement.  CODE STATUS: FULL  TOTAL TIME TAKING CARE OF THIS PATIENT: 29 minutes.  D/w nurse and Dr Juliann Pares  POSSIBLE D/C tomorrow, DEPENDING ON  CLINICAL CONDITION.   Abenezer Odonell M.D on 06/30/2016 at 10:50 AM  Between 7am to 6pm - Pager - [phone] After 6pm go to [email or web address] - password [employer]  [facility]  Office  [phone]  CC: Primary care physician; Selina Cooley, MD  Note: This dictation was prepared with Dragon dictation along with smaller phrase technology. Any transcriptional errors that result from this process are unintentional.

## 2016-06-30 NOTE — Care Management Important Message (Signed)
Important Message  Patient Details  Name: Heather [name] B Adami MRN: [id number] Date of Birth: [birth year]   Medicare Important Message Given:  Yes    Eber HongGreene, Camren Lipsett R, RN 06/30/2016, 1:43 PM

## 2016-06-30 NOTE — Plan of Care (Signed)
Problem: Respiratory: Goal: Respiratory status will improve Outcome: Progressing Weaned O2 to RA today

## 2016-07-01 DIAGNOSIS — J189 Pneumonia, unspecified organism: Secondary | ICD-10-CM | POA: Diagnosis not present

## 2016-07-01 DIAGNOSIS — R0902 Hypoxemia: Secondary | ICD-10-CM | POA: Diagnosis not present

## 2016-07-01 LAB — BASIC METABOLIC PANEL
Anion gap: 8 (ref 5–15)
BUN: 19 mg/dL (ref 6–20)
CO2: 27 mmol/L (ref 22–32)
Calcium: 8.6 mg/dL — ABNORMAL LOW (ref 8.9–10.3)
Chloride: 98 mmol/L — ABNORMAL LOW (ref 101–111)
Creatinine, Ser: 0.65 mg/dL (ref 0.44–1.00)
GFR calc Af Amer: >60 mL/min (ref >60)
GFR calc non Af Amer: >60 mL/min (ref >60)
Glucose, Bld: 97 mg/dL (ref 65–99)
Potassium: 3.9 mmol/L (ref 3.5–5.1)
Sodium: 133 mmol/L — ABNORMAL LOW (ref 135–145)

## 2016-07-01 MED ORDER — SODIUM CHLORIDE 0.9% FLUSH
3.0000 mL | Freq: Two times a day (BID) | INTRAVENOUS | Status: DC
  Administered 2016-07-01 – 2016-07-02 (×2): 3 mL via INTRAVENOUS

## 2016-07-01 MED ORDER — GUAIFENESIN ER 600 MG PO TB12
600.0000 mg | ORAL_TABLET | Freq: Two times a day (BID) | ORAL | Status: DC
  Administered 2016-07-01 – 2016-07-02 (×3): 600 mg via ORAL
  Filled 2016-07-01 (×3): qty 1

## 2016-07-01 NOTE — Progress Notes (Signed)
[facility] - [place] at [facility]   PATIENT NAME: Heather [name]    MR#:  [id number]  DATE OF BIRTH:  [birth year]  SUBJECTIVE:   Complains of congestion at nighttime but otherwise breathing improved  REVIEW OF SYSTEMS:    Review of Systems  Constitutional: Negative.  Negative for chills, fever and malaise/fatigue.  HENT: Negative.  Negative for ear discharge, ear pain, hearing loss, nosebleeds and sore throat.   Eyes: Negative.  Negative for blurred vision and pain.  Respiratory: Positive for cough, sputum production, shortness of breath and wheezing. Negative for hemoptysis.   Cardiovascular: Negative.  Negative for chest pain, palpitations and leg swelling.  Gastrointestinal: Negative.  Negative for abdominal pain, blood in stool, diarrhea, nausea and vomiting.  Genitourinary: Negative.  Negative for dysuria.  Musculoskeletal: Negative.  Negative for back pain.  Skin: Negative.   Neurological: Negative for dizziness, tremors, speech change, focal weakness, seizures and headaches.  Endo/Heme/Allergies: Negative.  Does not bruise/bleed easily.  Psychiatric/Behavioral: Negative.  Negative for depression, hallucinations and suicidal ideas.    Tolerating Diet: yes      DRUG ALLERGIES:  No Known Allergies  VITALS:  Blood pressure 110/71, pulse 91, temperature 97.9 F (36.6 C), temperature source Oral, resp. rate 16, height 5\' 5"  (1.651 m), weight 204 lb 12.8 oz (92.9 kg), SpO2 98 %.  PHYSICAL EXAMINATION:   Physical Exam  Constitutional: She is oriented to person, place, and time and well-developed, well-nourished, and in no distress. No distress.  HENT:  Head: Normocephalic.  Eyes: No scleral icterus.  Neck: Normal range of motion. Neck supple. No JVD present. No tracheal deviation present.  Cardiovascular: Normal rate, regular rhythm and normal heart sounds.  Exam reveals no gallop and no friction rub.   No murmur heard. Pulmonary/Chest: Effort normal.  No respiratory distress. She has no wheezes. She has no rales. She exhibits no tenderness.  Abdominal: Soft. Bowel sounds are normal. She exhibits no distension and no mass. There is no tenderness. There is no rebound and no guarding.  Musculoskeletal: Normal range of motion. She exhibits no edema.  Neurological: She is alert and oriented to person, place, and time.  Skin: Skin is warm. No rash noted. No erythema.  Psychiatric: Affect and judgment normal.      LABORATORY PANEL:   CBC  Recent Labs Lab 06/29/16 0138  WBC 6.8  HGB 14.1  HCT 41.4  PLT 208   ------------------------------------------------------------------------------------------------------------------  Chemistries   Recent Labs Lab 06/28/16 1843  07/01/16 0512  NA 133*  < > 133*  K 3.2*  < > 3.9  CL 98*  < > 98*  CO2 24  < > 27  GLUCOSE 127*  < > 97  BUN 18  < > 19  CREATININE 0.78  < > 0.65  CALCIUM 9.1  < > 8.6*  AST 36  --   --   ALT 21  --   --   ALKPHOS 71  --   --   BILITOT 0.1*  --   --   < > = values in this interval not displayed. ------------------------------------------------------------------------------------------------------------------  Cardiac Enzymes  Recent Labs Lab 06/29/16 0138 06/29/16 0732 06/29/16 1329  TROPONINI 0.04* 0.05* 0.03*   ------------------------------------------------------------------------------------------------------------------  RADIOLOGY:  Dg Chest 1 View  Result Date: 06/30/2016 CLINICAL DATA:  pneumonia EXAM: CHEST 1 VIEW COMPARISON:  CHEST RADIOGRAPH 06/28/2016 FINDINGS: Normal mediastinum and cardiac silhouette. Chronic central bronchitic markings. Normal pulmonary vasculature. No effusion, infiltrate, or pneumothorax. IMPRESSION: No evidence  pneumonia. Electronically Signed   By: Genevive BiStewart  Edmunds M.D.   On: 06/30/2016 09:57     ASSESSMENT AND PLAN:   82 year old female with a history of essential hypertension who presents with shortness  of breath found to have pneumonia and new onset atrial fibrillation.  1. Community acquired pneumonia seen on CT scan: Add Mucinex to current regimen  continue with Rocephin and azithromycin until tomorrow  2. New onset atrial fibrillation: Heart rate is controlled. Continue Diltiazem 120 mg daily Echocardiogram shows EF of 45% with moderate MR   will discuss anticoagulation for discharge   3. Acute hypoxic respiratory failure in the setting of community-acquired pneumonia and acute systolic heart failure: Continue antibiotics as above   Now resolved  Continue low-dose Lasix 20 IV every 12 BMP for a.m.  4. Hypothyroidism on Synthroid TSH 1.1 which is within normal limits  5. Essential hypertension: Continue diltiazem and losartan Discontinue Norvasc  6. History of depression on Zoloft  7. Hypokalemia: Improved with repletion   8. Acute systolic heart failure: Diuresing well continue current regimen  Management plans discussed with the patient and she is in agreement.  CODE STATUS: FULL  TOTAL TIME TAKING CARE OF THIS PATIENT: 29 minutes.   D/w nurse and Dr Juliann Parescallwood  POSSIBLE D/C tomorrow, DEPENDING ON CLINICAL CONDITION.   Auburn BilberryPATEL, Keonta Monceaux M.D on 07/01/2016 at 1:34 PM  Between 7am to 6pm - Pager - [phone] After 6pm go to [email or web address] - password [email or web address]  [facility]  Office  [phone]  CC: Primary care physician; Selina CooleyAXELBANK,ARTHUR, MD  Note: This dictation was prepared with Dragon dictation along with smaller phrase technology. Any transcriptional errors that result from this process are unintentional.

## 2016-07-02 DIAGNOSIS — R0902 Hypoxemia: Secondary | ICD-10-CM | POA: Diagnosis not present

## 2016-07-02 DIAGNOSIS — J189 Pneumonia, unspecified organism: Secondary | ICD-10-CM | POA: Diagnosis not present

## 2016-07-02 LAB — BASIC METABOLIC PANEL
Anion gap: 8 (ref 5–15)
BUN: 17 mg/dL (ref 6–20)
CO2: 28 mmol/L (ref 22–32)
Calcium: 8.4 mg/dL — ABNORMAL LOW (ref 8.9–10.3)
Chloride: 95 mmol/L — ABNORMAL LOW (ref 101–111)
Creatinine, Ser: 0.56 mg/dL (ref 0.44–1.00)
GFR calc Af Amer: >60 mL/min (ref >60)
GFR calc non Af Amer: >60 mL/min (ref >60)
Glucose, Bld: 92 mg/dL (ref 65–99)
Potassium: 3.5 mmol/L (ref 3.5–5.1)
Sodium: 131 mmol/L — ABNORMAL LOW (ref 135–145)

## 2016-07-02 MED ORDER — BENZONATATE 200 MG PO CAPS: 200.0000 mg | ORAL_CAPSULE | Freq: Three times a day (TID) | ORAL | 0 refills | Status: DC | PRN

## 2016-07-02 MED ORDER — CEFUROXIME AXETIL 500 MG PO TABS: 500.0000 mg | ORAL_TABLET | Freq: Two times a day (BID) | ORAL | 0 refills | Status: AC

## 2016-07-02 MED ORDER — DILTIAZEM HCL ER COATED BEADS 120 MG PO CP24: 120.0000 mg | ORAL_CAPSULE | Freq: Every day | ORAL | 0 refills | Status: DC

## 2016-07-02 MED ORDER — FUROSEMIDE 20 MG PO TABS: 20.0000 mg | ORAL_TABLET | Freq: Two times a day (BID) | ORAL | 0 refills | Status: DC

## 2016-07-02 MED ORDER — APIXABAN 5 MG PO TABS
5.0000 mg | ORAL_TABLET | Freq: Two times a day (BID) | ORAL | Status: DC
  Administered 2016-07-02: 5 mg via ORAL
  Filled 2016-07-02: qty 1

## 2016-07-02 MED ORDER — GUAIFENESIN ER 600 MG PO TB12: 600.0000 mg | ORAL_TABLET | Freq: Two times a day (BID) | ORAL | 0 refills | Status: DC

## 2016-07-02 MED ORDER — APIXABAN 5 MG PO TABS: 5.0000 mg | ORAL_TABLET | Freq: Two times a day (BID) | ORAL | 0 refills | Status: DC

## 2016-07-02 NOTE — Plan of Care (Signed)
Problem: Respiratory: Goal: Respiratory status will improve Outcome: Progressing Slept without distress this shift. Remains on room air.  Minimal shortness of breath with exertion.

## 2016-07-02 NOTE — Discharge Summary (Signed)
[facility] - [place] at [facility]  Heather [name], 82 y.o., DOB 08-28-1931, MRN [id number]. Admission date: 06/28/2016 Discharge Date 07/02/2016 Primary MD Selina CooleyAXELBANK,ARTHUR, MD Admitting Physician Oralia Manisavid Willis, MD  Admission Diagnosis  Hypoxia [R09.02] New onset atrial fibrillation (HCC) [I48.91] Acute systolic congestive heart failure (HCC) [I50.21] Dyspnea, unspecified type [R06.00] Community acquired pneumonia of left lung, unspecified part of lung [J18.9]  Discharge Diagnosis   Principal Problem:   CAP (community acquired pneumonia)   Anxiety   HTN (hypertension)   Hypothyroidism   GERD (gastroesophageal reflux disease)  Acute systolic congestive heart failure New onset atrial fibrillation       Hospital CourseAlice Judithann [name]  is a 82 y.o. female who presents with Intermittent but increasing shortness of breath for the past 3-4 weeks. Over the last 2-3 days she developed upper respiratory symptoms, which progressed to lower respiratory symptoms including wheezing and rhonchi. She came in the ED for evaluation tonight and was found to be in A. fib, with no prior diagnosis of A. fib. Also found to have likely pneumonia on imaging. Hospitals were called for admission and further treatment. Patient was treated with IV Lasix she continued to have symptoms therefore had a CT scan of the chest which showed pneumonia. Patient was treated with antibiotics. An IV diuresis with resolution of shortness of breath. Patient was noted to be in A. fib. It was discussed with her regarding anticoagulation. She agreed to be treated with a loquacious risk and benefits of bleeding were explained she is agreeable to starting anticoagulation.             Consults  cardiology  Significant Tests:  See full reports for all details    Dg Chest 1 View  Result Date: 06/30/2016 CLINICAL DATA:  pneumonia EXAM: CHEST 1 VIEW COMPARISON:  CHEST RADIOGRAPH 06/28/2016 FINDINGS: Normal  mediastinum and cardiac silhouette. Chronic central bronchitic markings. Normal pulmonary vasculature. No effusion, infiltrate, or pneumothorax. IMPRESSION: No evidence pneumonia. Electronically Signed   By: Genevive BiStewart  Edmunds M.D.   On: 06/30/2016 09:57   Dg Chest 2 View  Result Date: 06/28/2016 CLINICAL DATA:  Cough and shortness of breath for 1 week. EXAM: CHEST  2 VIEW COMPARISON:  None. FINDINGS: There is cardiomegaly without edema. The lungs are clear. No pneumothorax or pleural effusion. Aortic atherosclerosis is identified. Scoliosis is noted. Advanced degenerative changes seen about the left glenohumeral joint. IMPRESSION: Cardiomegaly without acute disease. Atherosclerosis. Electronically Signed   By: Drusilla Kannerhomas  Dalessio M.D.   On: 06/28/2016 16:45   Ct Angio Chest Pe W And/or Wo Contrast  Result Date: 06/28/2016 CLINICAL DATA:  Cough and cold, shortness of breath with elevated D-dimer EXAM: CT ANGIOGRAPHY CHEST WITH CONTRAST TECHNIQUE: Multidetector CT imaging of the chest was performed using the standard protocol during bolus administration of intravenous contrast. Multiplanar CT image reconstructions and MIPs were obtained to evaluate the vascular anatomy. CONTRAST:  75 mL Isovue 370 intravenous COMPARISON:  Chest x-ray 06/28/2016 FINDINGS: Cardiovascular: Satisfactory opacification of the pulmonary arteries to the segmental level. No evidence of pulmonary embolism. There is cardiomegaly with multi chamber enlargement. No large pericardial effusion. There are coronary artery calcifications. There is atherosclerosis of the aorta. Ascending segment is nonenlarged. Mild ectasia of the distal arch and proximal descending thoracic aorta. Mediastinum/Nodes: No significantly enlarged mediastinal or hilar lymph nodes. Thyroid is unremarkable. Trachea is midline. Esophagus grossly unremarkable. Lungs/Pleura: No pleural effusion or pneumothorax is visualized. Scattered ground-glass densities/mild mosaic  pattern. Mild centrilobular nodular densities within the  left lower lobe, suggestive of respiratory infection, to include atypical organisms. Upper Abdomen: Partially visualized calcified stones in the gallbladder. No acute abnormalities. Musculoskeletal: Degenerative changes of the spine. No acute or suspicious bone lesions. Review of the MIP images confirms the above findings. IMPRESSION: 1. No CT evidence for acute pulmonary embolus. 2. Cardiomegaly. 3. Scattered bilateral areas of ground-glass density/mild mosaic pattern, could relate to [name] airways disease or possible edema. Mild centrilobular nodular densities in the left lower lobe would be compatible with respiratory infection, to include atypical organisms. 4. Calcified gallstones Electronically Signed   By: Jasmine Pang M.D.   On: 06/28/2016 21:19       Today   Subjective:   Heather [name] patient feeling better denies any chest pain or shortness of breath has some cough  Objective:   Blood pressure (!) 139/94, pulse 78, temperature 98.1 F (36.7 C), resp. rate 18, height 5\' 5"  (1.651 m), weight 206 lb 9.6 oz (93.7 kg), SpO2 97 %.  .  Intake/Output Summary (Last 24 hours) at 07/02/16 1730 Last data filed at 07/02/16 0952  Gross per 24 hour  Intake                0 ml  Output             1850 ml  Net            -1850 ml    Exam VITAL SIGNS: Blood pressure (!) 139/94, pulse 78, temperature 98.1 F (36.7 C), resp. rate 18, height 5\' 5"  (1.651 m), weight 206 lb 9.6 oz (93.7 kg), SpO2 97 %.  GENERAL:  82 y.o.-year-old patient lying in the bed with no acute distress.  EYES: Pupils equal, round, reactive to light and accommodation. No scleral icterus. Extraocular muscles intact.  HEENT: Head atraumatic, normocephalic. Oropharynx and nasopharynx clear.  NECK:  Supple, no jugular venous distention. No thyroid enlargement, no tenderness.  LUNGS: Normal breath sounds bilaterally, no wheezing, rales,rhonchi or crepitation. No use of  accessory muscles of respiration.  CARDIOVASCULARIrregular irregularo murmurs, rubs, or gallops.  ABDOMEN: Soft, nontender, nondistended. Bowel sounds present. No organomegaly or mass.  EXTREMITIES: No pedal edema, cyanosis, or clubbing.  NEUROLOGIC: Cranial nerves II through XII are intact. Muscle strength 5/5 in all extremities. Sensation intact. Gait not checked.  PSYCHIATRIC: The patient is alert and oriented x 3.  SKIN: No obvious rash, lesion, or ulcer.   Data Review     CBC w Diff: Lab Results  Component Value Date   WBC 6.8 06/29/2016   HGB 14.1 06/29/2016   HCT 41.4 06/29/2016   PLT 208 06/29/2016   LYMPHOPCT 13 06/28/2016   MONOPCT 6 06/28/2016   EOSPCT 0 06/28/2016   BASOPCT 1 06/28/2016   CMP: Lab Results  Component Value Date   NA 131 (L) 07/02/2016   K 3.5 07/02/2016   CL 95 (L) 07/02/2016   CO2 28 07/02/2016   BUN 17 07/02/2016   CREATININE 0.56 07/02/2016   PROT 8.3 (H) 06/28/2016   ALBUMIN 4.3 06/28/2016   BILITOT 0.1 (L) 06/28/2016   ALKPHOS 71 06/28/2016   AST 36 06/28/2016   ALT 21 06/28/2016  .  Micro Results Recent Results (from the past 240 hour(s))  Blood culture (routine x 2)     Status: None (Preliminary result)   Collection Time: 06/28/16  9:58 PM  Result Value Ref Range Status   Specimen Description BLOOD  L AC  Final   Special Requests   Final  BOTTLES DRAWN AEROBIC AND ANAEROBIC  AER 4 ML ANA 5 ML   Culture NO GROWTH 4 DAYS  Final   Report Status PENDING  Incomplete  Blood culture (routine x 2)     Status: None (Preliminary result)   Collection Time: 06/28/16  9:58 PM  Result Value Ref Range Status   Specimen Description BLOOD  R ARM  Final   Special Requests   Final    BOTTLES DRAWN AEROBIC AND ANAEROBIC  AER 6 ML ANA 5 ML   Culture NO GROWTH 4 DAYS  Final   Report Status PENDING  Incomplete     Code Status History    Date Active Date Inactive Code Status Order ID Comments User Context   06/29/2016 12:57 AM 07/02/2016   5:21 PM Full Code [id number]  Oralia Manisavid Willis, MD Inpatient    Advance Directive Documentation   Flowsheet Row Most Recent Value  Type of Advance Directive  Healthcare Power of Attorney, Living will  Pre-existing out of facility DNR order (yellow form or pink MOST form)  No data  "MOST" Form in Place?  No data          Follow-up Information    Alwyn Peawayne D Callwood, MD. Schedule an appointment as soon as possible for a visit in 7 days.   Specialties:  Cardiology, Internal Medicine Contact information: [address] [facility] [place] [id number] [phone]        Selina CooleyAXELBANK,ARTHUR, MD. Schedule an appointment as soon as possible for a visit in 7 days.   Specialty:  Family Medicine          Discharge Medications   Allergies as of 07/02/2016   No Known Allergies     Medication List    STOP taking these medications   amLODipine 2.5 MG tablet Commonly known as:  NORVASC     TAKE these medications   albuterol (2.5 MG/3ML) 0.083% nebulizer solution Commonly known as:  PROVENTIL Take 2.5 mg by nebulization every 6 (six) hours as needed for wheezing or shortness of breath.   apixaban 5 MG Tabs tablet Commonly known as:  ELIQUIS Take 1 tablet (5 mg total) by mouth 2 (two) times daily.   benzonatate 200 MG capsule Commonly known as:  TESSALON Take 1 capsule (200 mg total) by mouth 3 (three) times daily as needed for cough.   cefUROXime 500 MG tablet Commonly known as:  CEFTIN Take 1 tablet (500 mg total) by mouth 2 (two) times daily with a meal.   diltiazem 120 MG 24 hr capsule Commonly known as:  CARDIZEM CD Take 1 capsule (120 mg total) by mouth daily.   furosemide 20 MG tablet Commonly known as:  LASIX Take 1 tablet (20 mg total) by mouth 2 (two) times daily.   guaiFENesin 600 MG 12 hr tablet Commonly known as:  MUCINEX Take 1 tablet (600 mg total) by mouth 2 (two) times daily.   levothyroxine 50 MCG tablet Commonly known as:   SYNTHROID, LEVOTHROID Take 50 mcg by mouth daily before breakfast.   losartan 25 MG tablet Commonly known as:  COZAAR Take 25 mg by mouth 2 (two) times daily.   sertraline 25 MG tablet Commonly known as:  ZOLOFT Take 25 mg by mouth daily. Take 1 tablet in morning and 1/2 tablet every evening.          Total Time in preparing paper work, data evaluation and todays exam - 35 minutes  Allena KatzPATEL, [facility] Medical CenterHREYANG M.D on  07/02/2016 at 5:30 [facility]  [facility]   Office  [phone]

## 2016-07-02 NOTE — Progress Notes (Signed)
Subjective:  Still complaining of shortness of breath and coughing no significant chest pain improved leg edema status had improved slightly except for cough . Patient states to feel somewhat better  Objective:  Vital Signs in the last 24 hours: Temp:  [97.9 F (36.6 C)-98.2 F (36.8 C)] 98.1 F (36.7 C) (12/17 0735) Pulse Rate:  [67-94] 78 (12/17 0735) Resp:  [18] 18 (12/17 0735) BP: (124-150)/(66-94) 139/94 (12/17 0735) SpO2:  [96 %-97 %] 97 % (12/17 0735) Weight:  [93.7 kg (206 lb 9.6 oz)] 93.7 kg (206 lb 9.6 oz) (12/17 0735)  Intake/Output from previous day: 12/16 0701 - 12/17 0700 In: 480 [P.O.:480] Out: 2925 [Urine:2925] Intake/Output from this shift: Total I/O In: -  Out: 200 [Urine:200]  Physical Exam: General appearance: alert, cooperative and appears stated age Neck: no adenopathy, no carotid bruit, no JVD, supple, symmetrical, trachea midline and thyroid not enlarged, symmetric, no tenderness/mass/nodules Lungs: diminished breath sounds bibasilar and bilaterally and rhonchi bibasilar and bilaterally Heart: irregularly irregular rhythm, S1, S2 normal and systolic ejection murmur 2/6 Abdomen: soft, non-tender; bowel sounds normal; no masses,  no organomegaly Extremities: extremities normal, atraumatic, no cyanosis or edema Pulses: 2+ and symmetric Skin: Skin color, texture, turgor normal. No rashes or lesions Neurologic: Alert and oriented X 3, normal strength and tone. Normal symmetric reflexes. Normal coordination and gait  Lab Results: No results for input(s): WBC, HGB, PLT in the last 72 hours.  Recent Labs  07/01/16 0512 07/02/16 0509  NA 133* 131*  K 3.9 3.5  CL 98* 95*  CO2 27 28  GLUCOSE 97 92  BUN 19 17  CREATININE 0.65 0.56    Recent Labs  06/29/16 1329  TROPONINI 0.03*   Hepatic Function Panel No results for input(s): PROT, ALBUMIN, AST, ALT, ALKPHOS, BILITOT, BILIDIR, IBILI in the last 72 hours. No results for input(s): CHOL in the last  72 hours. No results for input(s): PROTIME in the last 72 hours.  Imaging: Imaging results have been reviewed  Cardiac Studies:  Assessment/Plan:  Community-acquired pneumonia Atrial fibrillation new-onset Cardiomyopathy ejection fraction around 40-45% Leg edema Congestive heart failure mild Thyroid disease Shortness of breath Hypertension Mild depression . Plan Agree with antibiotic therapy for pneumonia Supplemental oxygen as necessary Anticoagulation for atrial fibrillation short-term as well as long-term while still in A. Fib Consider antiarrhythmic if still in A. Fib consider sotalol or amiodarone Continue rate control with diltiazem Continue inhalers for shortness of breath congestion Agree with levothyroxin therapy for thyroid management Continue sertraline therapy for depression Agree with medical therapy for anticoagulation Have the patient follow-up with cardiology as an outpatient Consider cardioversion after at least 4 weeks of anticoagulation either chemically or electrically  LOS: 4 days    Dwayne D Callwood 07/02/2016, 12:01 PM

## 2016-07-02 NOTE — Progress Notes (Signed)
Subjective:  Shortness of breath much improved still has some coughing at night no fever increase activity with exertion no chest pain. Patient feels ready to go home  Objective:  Vital Signs in the last 24 hours: Temp:  [97.9 F (36.6 C)-98.2 F (36.8 C)] 98.1 F (36.7 C) (12/17 0735) Pulse Rate:  [67-94] 78 (12/17 0735) Resp:  [18] 18 (12/17 0735) BP: (124-150)/(66-94) 139/94 (12/17 0735) SpO2:  [96 %-97 %] 97 % (12/17 0735) Weight:  [93.7 kg (206 lb 9.6 oz)] 93.7 kg (206 lb 9.6 oz) (12/17 0735)  Intake/Output from previous day: 12/16 0701 - 12/17 0700 In: 480 [P.O.:480] Out: 2925 [Urine:2925] Intake/Output from this shift: Total I/O In: -  Out: 200 [Urine:200]  Physical Exam: General appearance: alert, cooperative and appears stated age Neck: no adenopathy, no carotid bruit, no JVD, supple, symmetrical, trachea midline and thyroid not enlarged, symmetric, no tenderness/mass/nodules Lungs: diminished breath sounds bibasilar and bilaterally Heart: irregularly irregular rhythm Abdomen: soft, non-tender; bowel sounds normal; no masses,  no organomegaly Extremities: extremities normal, atraumatic, no cyanosis or edema Pulses: 2+ and symmetric Skin: Skin color, texture, turgor normal. No rashes or lesions Neurologic: Alert and oriented X 3, normal strength and tone. Normal symmetric reflexes. Normal coordination and gait  Lab Results: No results for input(s): WBC, HGB, PLT in the last 72 hours.  Recent Labs  07/01/16 0512 07/02/16 0509  NA 133* 131*  K 3.9 3.5  CL 98* 95*  CO2 27 28  GLUCOSE 97 92  BUN 19 17  CREATININE 0.65 0.56    Recent Labs  06/29/16 1329  TROPONINI 0.03*   Hepatic Function Panel No results for input(s): PROT, ALBUMIN, AST, ALT, ALKPHOS, BILITOT, BILIDIR, IBILI in the last 72 hours. No results for input(s): CHOL in the last 72 hours. No results for input(s): PROTIME in the last 72 hours.  Imaging: Imaging results have been  reviewed  Cardiac Studies:  Assessment/Plan:  Atrial Fibrillation Cardiomyopathy Chest Pain CHF Edema Shortness of Breath  Hypertension Pneumonia Hypothyroidism Obesity mild . Plan Agree with antibiotic therapy for pneumonia Continue inhaler therapy for shortness of breath Agree with diuretic therapy for heart failure and coagulopathy Continue losartan diltiazem for blood pressure control Maintain diltiazem for rate control for A. Fib Agree with close for anticoagulation for A. Fib Continue diuretic therapy for edema Continue levothyroxin therapy for hypothyroidism Follow-up with cardiology as an outpatient for heart failure and shortness of breath Recommended outpatient assessment and evaluation for possible coronary disease including functional study   LOS: 4 days    Zaia Carre D Darlys Buis 07/02/2016, 11:51 AM

## 2016-07-02 NOTE — Discharge Instructions (Signed)
[facility] - [place] at [facility]  DIET:  Cardiac diet  DISCHARGE CONDITION:  Stable  ACTIVITY:  Activity as tolerated  OXYGEN:  Home Oxygen: No.   Oxygen Delivery: room air  DISCHARGE LOCATION:  home    ADDITIONAL DISCHARGE INSTRUCTION:please watch daily salt intake   If you experience worsening of your admission symptoms, develop shortness of breath, life threatening emergency, suicidal or homicidal thoughts you must seek medical attention immediately by calling 911 or calling your MD immediately  if symptoms less severe.  You Must read complete instructions/literature along with all the possible adverse reactions/side effects for all the Medicines you take and that have been prescribed to you. Take any new Medicines after you have completely understood and accpet all the possible adverse reactions/side effects.   Please note  You were cared for by a hospitalist during your hospital stay. If you have any questions about your discharge medications or the care you received while you were in the hospital after you are discharged, you can call the unit and asked to speak with the hospitalist on call if the hospitalist that took care of you is not available. Once you are discharged, your primary care physician will handle any further medical issues. Please note that NO REFILLS for any discharge medications will be authorized once you are discharged, as it is imperative that you return to your primary care physician (or establish a relationship with a primary care physician if you do not have one) for your aftercare needs so that they can reassess your need for medications and monitor your lab values.

## 2016-07-02 NOTE — Progress Notes (Signed)
Discharge instructions explained to pt and pts daughter/ verbalized an understanding/ iv and tele removed/ Eliquis coupon given to pt/ transported off unit via wheelchair.

## 2016-07-03 LAB — CULTURE, BLOOD (ROUTINE X 2)
Culture: NO GROWTH
Culture: NO GROWTH

## 2016-07-19 DIAGNOSIS — I110 Hypertensive heart disease with heart failure: Secondary | ICD-10-CM | POA: Insufficient documentation

## 2016-07-19 DIAGNOSIS — I5020 Unspecified systolic (congestive) heart failure: Secondary | ICD-10-CM | POA: Insufficient documentation

## 2016-07-26 ENCOUNTER — Ambulatory Visit: Admit: 2016-07-26 | Payer: Medicare Other | Admitting: Internal Medicine

## 2016-07-26 SURGERY — CARDIOVERSION (CATH LAB)
Anesthesia: General

## 2016-08-16 ENCOUNTER — Other Ambulatory Visit
Admission: RE | Admit: 2016-08-16 | Discharge: 2016-08-16 | Disposition: A | Discharge status: Home / Self Care | Payer: Medicare Other | Source: Ambulatory Visit | Attending: Ophthalmology | Admitting: Ophthalmology

## 2016-08-16 ENCOUNTER — Ambulatory Visit: Admit: 2016-08-16 | Payer: Medicare Other | Admitting: Internal Medicine

## 2016-08-16 DIAGNOSIS — M316 Other giant cell arteritis: Secondary | ICD-10-CM | POA: Diagnosis present

## 2016-08-16 LAB — C-REACTIVE PROTEIN: CRP: 1 mg/dL — ABNORMAL HIGH (ref <1.0)

## 2016-08-16 LAB — PLATELET COUNT: Platelets: 288 10*3/uL (ref 150–440)

## 2016-08-16 LAB — SEDIMENTATION RATE: Sed Rate: 24 mm/hr (ref 0–30)

## 2016-08-16 SURGERY — CARDIOVERSION (CATH LAB)
Anesthesia: General

## 2016-08-23 ENCOUNTER — Ambulatory Visit: Admit: 2016-08-23 | Payer: Medicare Other | Admitting: Internal Medicine

## 2016-08-23 SURGERY — CARDIOVERSION (CATH LAB)
Anesthesia: General

## 2017-03-27 DIAGNOSIS — I10 Essential (primary) hypertension: Secondary | ICD-10-CM | POA: Insufficient documentation

## 2017-09-26 DIAGNOSIS — I4821 Permanent atrial fibrillation: Secondary | ICD-10-CM | POA: Insufficient documentation

## 2017-10-16 DIAGNOSIS — F32A Depression, unspecified: Secondary | ICD-10-CM | POA: Insufficient documentation

## 2018-05-24 ENCOUNTER — Emergency Department
Admission: EM | Admit: 2018-05-24 | Discharge: 2018-05-24 | Disposition: A | Discharge status: Home / Self Care | Payer: Medicare Other | Attending: Emergency Medicine | Admitting: Emergency Medicine

## 2018-05-24 ENCOUNTER — Encounter: Payer: Self-pay | Admitting: Emergency Medicine

## 2018-05-24 ENCOUNTER — Other Ambulatory Visit: Payer: Self-pay

## 2018-05-24 DIAGNOSIS — I10 Essential (primary) hypertension: Secondary | ICD-10-CM | POA: Insufficient documentation

## 2018-05-24 DIAGNOSIS — R51 Headache: Secondary | ICD-10-CM | POA: Diagnosis present

## 2018-05-24 DIAGNOSIS — Z87891 Personal history of nicotine dependence: Secondary | ICD-10-CM | POA: Insufficient documentation

## 2018-05-24 DIAGNOSIS — Z79899 Other long term (current) drug therapy: Secondary | ICD-10-CM | POA: Diagnosis not present

## 2018-05-24 DIAGNOSIS — E039 Hypothyroidism, unspecified: Secondary | ICD-10-CM | POA: Insufficient documentation

## 2018-05-24 DIAGNOSIS — G44219 Episodic tension-type headache, not intractable: Secondary | ICD-10-CM | POA: Diagnosis not present

## 2018-05-24 DIAGNOSIS — Z7901 Long term (current) use of anticoagulants: Secondary | ICD-10-CM | POA: Diagnosis not present

## 2018-05-24 HISTORY — DX: Unspecified atrial fibrillation: I48.91

## 2018-05-24 LAB — CBC
HCT: 44.6 % (ref 36.0–46.0)
Hemoglobin: 14.3 g/dL (ref 12.0–15.0)
MCH: 30.6 pg (ref 26.0–34.0)
MCHC: 32.1 g/dL (ref 30.0–36.0)
MCV: 95.3 fL (ref 80.0–100.0)
Platelets: 231 10*3/uL (ref 150–400)
RBC: 4.68 MIL/uL (ref 3.87–5.11)
RDW: 12.4 % (ref 11.5–15.5)
WBC: 7.8 10*3/uL (ref 4.0–10.5)
nRBC: 0 % (ref 0.0–0.2)

## 2018-05-24 LAB — BASIC METABOLIC PANEL
Anion gap: 8 (ref 5–15)
BUN: 18 mg/dL (ref 8–23)
CO2: 31 mmol/L (ref 22–32)
Calcium: 9.2 mg/dL (ref 8.9–10.3)
Chloride: 98 mmol/L (ref 98–111)
Creatinine, Ser: 0.71 mg/dL (ref 0.44–1.00)
GFR calc Af Amer: >60 mL/min (ref >60)
GFR calc non Af Amer: >60 mL/min (ref >60)
Glucose, Bld: 119 mg/dL — ABNORMAL HIGH (ref 70–99)
Potassium: 3.5 mmol/L (ref 3.5–5.1)
Sodium: 137 mmol/L (ref 135–145)

## 2018-05-24 NOTE — ED Provider Notes (Signed)
[facility] Emergency Department Provider Note   ____________________________________________    I have reviewed the triage vital signs and the nursing notes.   HISTORY  Chief Complaint Hypertension     HPI Heather [name] is a 84 y.o. female who presents with high blood pressure.  Patient reports she went on a senior trip yesterday to a musical which was quite loud and overall very stressful.  She developed a headache during the trip and continue to have a headache this morning.  Her daughter checked her blood pressure and found it to be elevated.  The patient has no neuro deficits.  No chest pain.  No abdominal pain nausea or vomiting.  She feels quite well currently although continues to have a mild headache   Past Medical History:  Diagnosis Date  . Anxiety   . Atrial fibrillation (HCC)   . GERD (gastroesophageal reflux disease)   . Hypertension   . Thyroid disease     Patient Active Problem List   Diagnosis Date Noted  . CAP (community acquired pneumonia) 06/28/2016  . Anxiety 06/28/2016  . HTN (hypertension) 06/28/2016  . Hypothyroidism 06/28/2016  . GERD (gastroesophageal reflux disease) 06/28/2016    Past Surgical History:  Procedure Laterality Date  . ABDOMINAL HYSTERECTOMY    . APPENDECTOMY      Prior to Admission medications   Medication Sig Start Date End Date Taking? Authorizing Provider  apixaban (ELIQUIS) 5 MG TABS tablet Take 1 tablet (5 mg total) by mouth 2 (two) times daily. 07/02/16   Auburn Bilberry, MD  diltiazem (CARDIZEM CD) 180 MG 24 hr capsule Take 180 mg by mouth daily. 07/07/16   [provider]  furosemide (LASIX) 20 MG tablet Take 1 tablet (20 mg total) by mouth 2 (two) times daily. Patient taking differently: Take 20 mg by mouth daily.  07/02/16   Auburn Bilberry, MD  levothyroxine (SYNTHROID, LEVOTHROID) 50 MCG tablet Take 50 mcg by mouth daily before breakfast.    [provider]    losartan (COZAAR) 25 MG tablet Take 25 mg by mouth 2 (two) times daily.     [provider]  Multiple Vitamins-Minerals (MULTIVITAMIN WITH MINERALS) tablet Take 1 tablet by mouth daily.    [provider]  Potassium 99 MG TABS Take 1 tablet by mouth daily.    [provider]  sertraline (ZOLOFT) 25 MG tablet Take 12.5-25 mg by mouth 2 (two) times daily. Take 1 tablet (25 mg) in morning and 1/2 tablet (12.5 mg) every evening.    [provider]     Allergies Codeine and Sulfa antibiotics  Family History  Family history unknown: Yes    Social History Social History   Tobacco Use  . Smoking status: Former Games developer  . Smokeless tobacco: Never Used  Substance Use Topics  . Alcohol use: No  . Drug use: No    Review of Systems  Constitutional: No fever/chills Eyes: No visual changes.  ENT: No sore throat. Cardiovascular: Denies chest pain. Respiratory: Denies shortness of breath. Gastrointestinal: No abdominal pain.  No nausea, no vomiting.   Genitourinary: Negative for dysuria. Musculoskeletal: Negative for back pain. Skin: Negative for rash. Neurological: Negative for headaches or weakness   ____________________________________________   PHYSICAL EXAM:  VITAL SIGNS: ED Triage Vitals  Enc Vitals Group     BP 05/24/18 0936 (!) 171/110     Pulse Rate 05/24/18 0936 94     Resp 05/24/18 0936 20  Temp 05/24/18 0936 (!) 97.5 F (36.4 C)     Temp Source 05/24/18 0936 Oral     SpO2 05/24/18 0936 96 %     Weight 05/24/18 0936 86.2 kg (190 lb)     Height 05/24/18 0936 1.664 m (5' 5.5")     Head Circumference --      Peak Flow --      Pain Score 05/24/18 0950 5     Pain Loc --      Pain Edu? --      Excl. in GC? --     Constitutional: Alert and oriented. No acute distress.   Nose: No congestion/rhinnorhea. Mouth/Throat: Mucous membranes are moist.    Cardiovascular: Normal rate, regular rhythm. Grossly normal heart sounds.   Good peripheral circulation. Respiratory: Normal respiratory effort.  No retractions. Gastrointestinal: Soft and nontender. No distention.    Musculoskeletal: No lower extremity tenderness nor edema.  Warm and well perfused Neurologic:  Normal speech and language. No gross focal neurologic deficits are appreciated.  Skin:  Skin is warm, dry and intact. No rash noted. Psychiatric: Mood and affect are normal. Speech and behavior are normal.  ____________________________________________   LABS (all labs ordered are listed, but only abnormal results are displayed)  Labs Reviewed  BASIC METABOLIC PANEL - Abnormal; Notable for the following components:      Result Value   Glucose, Bld 119 (*)    All other components within normal limits  CBC  URINALYSIS, COMPLETE (UACMP) WITH MICROSCOPIC  CBG MONITORING, ED   ____________________________________________  EKG  ED ECG REPORT I, Jene Every, the attending physician, personally viewed and interpreted this ECG.  Date: 05/24/2018  Rate: Normal rate Rhythm: Atrial fibrillation QRS Axis: normal Intervals: PVCs ST/T Wave abnormalities: normal Narrative Interpretation: no evidence of acute ischemia  ____________________________________________  RADIOLOGY  None ____________________________________________   PROCEDURES  Procedure(s) performed: No  Procedures   Critical Care performed:No ____________________________________________   INITIAL IMPRESSION / ASSESSMENT AND PLAN / ED COURSE  Pertinent labs & imaging results that were available during my care of the patient were reviewed by me and considered in my medical decision making (see chart for details).  Patient well-appearing in no acute distress.  She denies have a mild headache.  No neuro deficits or concerning symptoms.  She would like to take Tylenol for headache and eat some food.  She does report compliance with her blood pressure medications  Patient felt  better after taking Tylenol.  She did eat some food as well.  Blood pressure has improved.  She will follow-up with her PCP    ____________________________________________   FINAL CLINICAL IMPRESSION(S) / ED DIAGNOSES  Final diagnoses:  Essential hypertension  Episodic tension-type headache, not intractable        Note:  This document was prepared using Dragon voice recognition software and may include unintentional dictation errors.    Jene Every, MD 05/24/18 [phone]

## 2018-05-24 NOTE — ED Notes (Addendum)
Pt given crackers, peanut butter, and juice OK per EDP.

## 2018-05-24 NOTE — ED Triage Notes (Signed)
Pt to ED via POV c/o high blood pressure. Pt states that she took her blood pressure this morning and noticed that it was high. Pt states that she is on a "low dose" of BP med, states that she had taken medication about 20 minutes prior to checking her blood pressure. Pt states that she had a headache this morning and felt like she was going to pass out. Pt states that she still has a headache but it is not as bad now. Pt is in NAD at this time.

## 2018-05-24 NOTE — ED Notes (Signed)
C/o headache that started last night and HTN that was 187/101 this morning per pt with home BP equipment. Also reports going uot with friends yesterday and that the loud music she was around made her head hurt.

## 2018-05-24 NOTE — ED Notes (Signed)
Sent Rainbow to lab

## 2018-06-27 DIAGNOSIS — F331 Major depressive disorder, recurrent, moderate: Secondary | ICD-10-CM | POA: Insufficient documentation

## 2018-06-27 DIAGNOSIS — H3530 Unspecified macular degeneration: Secondary | ICD-10-CM | POA: Insufficient documentation

## 2019-10-06 ENCOUNTER — Other Ambulatory Visit: Payer: Self-pay

## 2019-10-06 DIAGNOSIS — R3129 Other microscopic hematuria: Secondary | ICD-10-CM

## 2019-10-07 ENCOUNTER — Encounter: Payer: Self-pay | Admitting: Urology

## 2019-10-07 ENCOUNTER — Ambulatory Visit: Payer: Medicare Other | Admitting: Urology

## 2019-10-07 ENCOUNTER — Other Ambulatory Visit: Payer: Self-pay

## 2019-10-07 ENCOUNTER — Other Ambulatory Visit
Admission: RE | Admit: 2019-10-07 | Discharge: 2019-10-07 | Disposition: A | Discharge status: Home / Self Care | Payer: Medicare Other | Attending: Urology | Admitting: Urology

## 2019-10-07 VITALS — BP 156/85 | HR 76 | Ht 65.0 in | Wt 189.0 lb

## 2019-10-07 DIAGNOSIS — R3129 Other microscopic hematuria: Secondary | ICD-10-CM | POA: Diagnosis not present

## 2019-10-07 LAB — URINALYSIS, COMPLETE (UACMP) WITH MICROSCOPIC
Bacteria, UA: NONE SEEN
Bilirubin Urine: NEGATIVE
Glucose, UA: NEGATIVE mg/dL
Ketones, ur: NEGATIVE mg/dL
Nitrite: NEGATIVE
Protein, ur: NEGATIVE mg/dL
RBC / HPF: >50 RBC/hpf (ref 0–5)
Specific Gravity, Urine: 1.015 (ref 1.005–1.030)
pH: 5.5 (ref 5.0–8.0)

## 2019-10-07 NOTE — Patient Instructions (Signed)
You have microscopic blood in your urine.  Possible causes of microscopic hematuria including idiopathic, kidney stones, medical renal disease, and malignancy like bladder cancer or kidney cancer. Work-up to figure out where the blood is coming from includes a CT scan, and a cystoscopy to look into the bladder to evaluate for any bladder tumors.   Cystoscopy Cystoscopy is a procedure that is used to help diagnose and sometimes treat conditions that affect the lower urinary tract. The lower urinary tract includes the bladder and the urethra. The urethra is the tube that drains urine from the bladder. Cystoscopy is done using a thin, tube-shaped instrument with a light and camera at the end (cystoscope). The cystoscope may be hard or flexible, depending on the goal of the procedure. The cystoscope is inserted through the urethra, into the bladder. Cystoscopy may be recommended if you have:  Urinary tract infections that keep coming back.  Blood in the urine (hematuria).  An inability to control when you urinate (urinary incontinence) or an overactive bladder.  Unusual cells found in a urine sample.  A blockage in the urethra, such as a urinary stone.  Painful urination.  An abnormality in the bladder found during an intravenous pyelogram (IVP) or CT scan. Cystoscopy may also be done to remove a sample of tissue to be examined under a microscope (biopsy). Tell a health care provider about:  Any allergies you have.  All medicines you are taking, including vitamins, herbs, eye drops, creams, and over-the-counter medicines.  Any problems you or family members have had with anesthetic medicines.  Any blood disorders you have.  Any surgeries you have had.  Any medical conditions you have.  Whether you are pregnant or may be pregnant. What are the risks? Generally, this is a safe procedure. However, problems may occur, including:  Infection.  Bleeding.  Allergic reactions to  medicines.  Damage to other structures or organs. What happens before the procedure?  Ask your health care provider about: ? Changing or stopping your regular medicines. This is especially important if you are taking diabetes medicines or blood thinners. ? Taking medicines such as aspirin and ibuprofen. These medicines can thin your blood. Do not take these medicines unless your health care provider tells you to take them. ? Taking over-the-counter medicines, vitamins, herbs, and supplements.  Follow instructions from your health care provider about eating or drinking restrictions.  Ask your health care provider what steps will be taken to help prevent infection. These may include: ? Washing skin with a germ-killing soap. ? Taking antibiotic medicine.  You may have an exam or testing, such as: ? X-rays of the bladder, urethra, or kidneys. ? Urine tests to check for signs of infection.  Plan to have someone take you home from the hospital or clinic. What happens during the procedure?   You will be given one or more of the following: ? A medicine to help you relax (sedative). ? A medicine to numb the area (local anesthetic).  The area around the opening of your urethra will be cleaned.  The cystoscope will be passed through your urethra into your bladder.  Germ-free (sterile) fluid will flow through the cystoscope to fill your bladder. The fluid will stretch your bladder so that your health care provider can clearly examine your bladder walls.  Your doctor will look at the urethra and bladder. Your doctor may take a biopsy or remove stones.  The cystoscope will be removed, and your bladder will be emptied. The  procedure may vary among health care providers and hospitals. What can I expect after the procedure? After the procedure, it is common to have:  Some soreness or pain in your abdomen and urethra.  Urinary symptoms. These include: ? Mild pain or burning when you  urinate. Pain should stop within a few minutes after you urinate. This may last for up to 1 week. ? A small amount of blood in your urine for several days. ? Feeling like you need to urinate but producing only a small amount of urine. Follow these instructions at home: Medicines  Take over-the-counter and prescription medicines only as told by your health care provider.  If you were prescribed an antibiotic medicine, take it as told by your health care provider. Do not stop taking the antibiotic even if you start to feel better. General instructions  Return to your normal activities as told by your health care provider. Ask your health care provider what activities are safe for you.  Do not drive for 24 hours if you were given a sedative during your procedure.  Watch for any blood in your urine. If the amount of blood in your urine increases, call your health care provider.  Follow instructions from your health care provider about eating or drinking restrictions.  If a tissue sample was removed for testing (biopsy) during your procedure, it is up to you to get your test results. Ask your health care provider, or the department that is doing the test, when your results will be ready.  Drink enough fluid to keep your urine pale yellow.  Keep all follow-up visits as told by your health care provider. This is important. Contact a health care provider if you:  Have pain that gets worse or does not get better with medicine, especially pain when you urinate.  Have trouble urinating.  Have more blood in your urine. Get help right away if you:  Have blood clots in your urine.  Have abdominal pain.  Have a fever or chills.  Are unable to urinate. Summary  Cystoscopy is a procedure that is used to help diagnose and sometimes treat conditions that affect the lower urinary tract.  Cystoscopy is done using a thin, tube-shaped instrument with a light and camera at the end.  After the  procedure, it is common to have some soreness or pain in your abdomen and urethra.  Watch for any blood in your urine. If the amount of blood in your urine increases, call your health care provider.  If you were prescribed an antibiotic medicine, take it as told by your health care provider. Do not stop taking the antibiotic even if you start to feel better. This information is not intended to replace advice given to you by your health care provider. Make sure you discuss any questions you have with your health care provider. Document Revised: 06/25/2018 Document Reviewed: 06/25/2018 Elsevier Patient Education  2020 [employer].

## 2019-10-07 NOTE — Progress Notes (Signed)
   10/07/19 11:31 AM   Heather [name] 1932/03/19 [id number]  CC: Microscopic hematuria  HPI: I saw Heather [name] in urology clinic today for evaluation of microscopic hematuria.  She was recently found to have 10-50 RBCs on urinalysis on 09/18/2019.  She is an 86 year old female with past medical history notable for anxiety/depression, and atrial fibrillation on Eliquis.  Past surgical history is notable for abdominal hysterectomy and appendectomy.  She denies any weight loss, abdominal pain, or flank pain.  She denies any urinary symptoms of dysuria, urgency, frequency, or gross hematuria.  She has a 3-pack-year smoking history and quit over 50 years ago.  She previously worked for [employer] and denies any carcinogenic exposures.  She has no family history of urologic malignancies.  She denies any recurrent UTIs.  Urinalysis today notable for greater than 50 RBCs.   PMH: Past Medical History:  Diagnosis Date  . Anxiety   . Atrial fibrillation (HCC)   . GERD (gastroesophageal reflux disease)   . Hypertension   . Thyroid disease     Surgical History: Past Surgical History:  Procedure Laterality Date  . ABDOMINAL HYSTERECTOMY    . APPENDECTOMY      Family History: Family History  Family history unknown: Yes    Social History:  reports that she has quit smoking. She has never used smokeless tobacco. She reports that she does not drink alcohol or use drugs.  Physical Exam: BP (!) 156/85 (BP Location: Left Wrist, Patient Position: Sitting, Cuff Size: Normal)   Pulse 76   Ht 5\' 5"  (1.651 m)   Wt 189 lb (85.7 kg)   BMI 31.45 kg/m    Constitutional:  Alert and oriented, No acute distress. Cardiovascular: No clubbing, cyanosis, or edema. Respiratory: Normal respiratory effort, no increased work of breathing. GI: Abdomen is soft, nontender, nondistended, no abdominal masses Lymph: No cervical or inguinal lymphadenopathy. Skin: No rashes, bruises or suspicious  lesions. Neurologic: Grossly intact, no focal deficits, moving all 4 extremities. Psychiatric: Normal mood and affect.  Laboratory Data: Reviewed, urinalysis today with greater than 50 RBCs  Pertinent Imaging: None to review  Assessment & Plan:   In summary, she is an 86 year old female with 2 episodes of asymptomatic microscopic hematuria, with greater than 50 RBCs on urinalysis today.  We discussed common possible etiologies of microscopic hematuria including idiopathic, urolithiasis, medical renal disease, and malignancy. We discussed the new asymptomatic microscopic hematuria guidelines and risk categories of low, intermediate, and high risk that are based on age, risk factors like smoking, and degree of microscopic hematuria. We discussed work-up can range from repeat urinalysis, renal ultrasound and cystoscopy, to CT urogram and cystoscopy.  She falls into the high risk category, and I recommended proceeding with CT urogram and cystoscopy.   CT urogram and cystoscopy for completion of high risk microscopic hematuria work-up   85, MD 10/07/2019  [facility] [address] [place], [place] [phone]

## 2019-10-14 ENCOUNTER — Ambulatory Visit: Payer: Self-pay | Admitting: Urology

## 2019-10-15 ENCOUNTER — Ambulatory Visit
Admission: RE | Admit: 2019-10-15 | Discharge: 2019-10-15 | Disposition: A | Discharge status: Home / Self Care | Payer: Medicare Other | Source: Ambulatory Visit | Attending: Urology | Admitting: Urology

## 2019-10-15 ENCOUNTER — Other Ambulatory Visit: Payer: Self-pay

## 2019-10-15 DIAGNOSIS — R3129 Other microscopic hematuria: Secondary | ICD-10-CM | POA: Diagnosis not present

## 2019-10-15 HISTORY — DX: Heart failure, unspecified: I50.9

## 2019-10-15 MED ORDER — IOHEXOL 300 MG/ML  SOLN
100.0000 mL | Freq: Once | INTRAMUSCULAR | Status: AC | PRN
  Administered 2019-10-15: 100 mL via INTRAVENOUS

## 2019-10-16 ENCOUNTER — Telehealth: Payer: Self-pay | Admitting: *Deleted

## 2019-10-16 NOTE — Telephone Encounter (Signed)
OK to read her the impression on CT. No urologic findings re: her hematuria, still need to complete cysto.  I will forward the findings to her PCP to discuss pleural effusion and pelvic cyst, will likely need additional imaging of these areas  Legrand Rams, MD 10/16/2019

## 2019-10-16 NOTE — Telephone Encounter (Signed)
Patient called regarding her CT results, if you could review and give her results prior to her cysto appointment. Please advise.

## 2019-10-16 NOTE — Telephone Encounter (Signed)
Patient informed, voiced understanding.  °

## 2019-10-21 ENCOUNTER — Telehealth: Payer: Self-pay | Admitting: Urology

## 2019-10-21 ENCOUNTER — Other Ambulatory Visit: Payer: Medicare Other | Admitting: Urology

## 2019-10-21 NOTE — Telephone Encounter (Signed)
-----   Message from Sondra Come, MD sent at 10/17/2019  7:26 AM EDT ----- Please forward CT results to her PCP Dr. Marcello Fennel, thanks  Legrand Rams, MD 10/17/2019

## 2019-10-21 NOTE — Telephone Encounter (Signed)
done

## 2019-10-27 ENCOUNTER — Other Ambulatory Visit: Payer: Self-pay | Admitting: Family Medicine

## 2019-10-27 DIAGNOSIS — R3129 Other microscopic hematuria: Secondary | ICD-10-CM

## 2019-10-28 ENCOUNTER — Other Ambulatory Visit: Payer: Self-pay | Admitting: Internal Medicine

## 2019-10-28 ENCOUNTER — Other Ambulatory Visit: Payer: Self-pay

## 2019-10-28 ENCOUNTER — Encounter: Payer: Self-pay | Admitting: Urology

## 2019-10-28 ENCOUNTER — Ambulatory Visit: Payer: Medicare Other | Admitting: Urology

## 2019-10-28 VITALS — BP 136/69 | HR 76 | Ht 65.0 in | Wt 189.0 lb

## 2019-10-28 DIAGNOSIS — N949 Unspecified condition associated with female genital organs and menstrual cycle: Secondary | ICD-10-CM

## 2019-10-28 DIAGNOSIS — R3129 Other microscopic hematuria: Secondary | ICD-10-CM | POA: Diagnosis not present

## 2019-10-28 DIAGNOSIS — K668 Other specified disorders of peritoneum: Secondary | ICD-10-CM

## 2019-10-28 DIAGNOSIS — J90 Pleural effusion, not elsewhere classified: Secondary | ICD-10-CM

## 2019-10-28 MED ORDER — LIDOCAINE HCL URETHRAL/MUCOSAL 2 % EX GEL
1.0000 "application " | Freq: Once | CUTANEOUS | Status: AC
  Administered 2019-10-28: 1 via URETHRAL

## 2019-10-28 NOTE — Progress Notes (Signed)
Cystoscopy Procedure Note:  Indication: High risk microscopic hematuria  After informed consent and discussion of the procedure and its risks, Heather [name] was positioned and prepped in the standard fashion. Small urethral caruncle at 6 o clock position. Cystoscopy was performed with a flexible cystoscope. The urethra, bladder neck and entire bladder was visualized in a standard fashion. The ureteral orifices were visualized in their normal location and orientation. Normal bladder mucosa throughout, no abnormalities on retroflexion.  Imaging: Reviewed, no abnormal urologic findings. Right pleural effusion and right cystic lesion in adnexal space. Results forwarded to PCP for follow up imaging.  Findings: Normal cystoscopy  Assessment and Plan: Follow up imaging per PCP No urology follow up needed  Legrand Rams, MD 10/28/2019

## 2019-11-03 ENCOUNTER — Other Ambulatory Visit
Admission: RE | Admit: 2019-11-03 | Discharge: 2019-11-03 | Disposition: A | Discharge status: Home / Self Care | Payer: Medicare Other | Source: Home / Self Care | Attending: Internal Medicine | Admitting: Internal Medicine

## 2019-11-03 ENCOUNTER — Ambulatory Visit
Admission: RE | Admit: 2019-11-03 | Discharge: 2019-11-03 | Disposition: A | Discharge status: Home / Self Care | Payer: Medicare Other | Source: Ambulatory Visit | Attending: Internal Medicine | Admitting: Internal Medicine

## 2019-11-03 ENCOUNTER — Other Ambulatory Visit: Payer: Self-pay

## 2019-11-03 DIAGNOSIS — N949 Unspecified condition associated with female genital organs and menstrual cycle: Secondary | ICD-10-CM | POA: Diagnosis present

## 2019-11-03 DIAGNOSIS — J90 Pleural effusion, not elsewhere classified: Secondary | ICD-10-CM | POA: Insufficient documentation

## 2019-11-03 DIAGNOSIS — K668 Other specified disorders of peritoneum: Secondary | ICD-10-CM | POA: Insufficient documentation

## 2019-11-03 LAB — CREATININE, SERUM
Creatinine, Ser: 1.21 mg/dL — ABNORMAL HIGH (ref 0.44–1.00)
GFR calc Af Amer: 47 mL/min — ABNORMAL LOW (ref >60)
GFR calc non Af Amer: 41 mL/min — ABNORMAL LOW (ref >60)

## 2019-11-03 MED ORDER — IOHEXOL 300 MG/ML  SOLN
60.0000 mL | Freq: Once | INTRAMUSCULAR | Status: AC | PRN
  Administered 2019-11-03: 60 mL via INTRAVENOUS

## 2019-11-12 ENCOUNTER — Other Ambulatory Visit: Payer: Self-pay | Admitting: Internal Medicine

## 2019-11-12 DIAGNOSIS — K668 Other specified disorders of peritoneum: Secondary | ICD-10-CM

## 2019-11-12 DIAGNOSIS — N949 Unspecified condition associated with female genital organs and menstrual cycle: Secondary | ICD-10-CM

## 2019-11-12 DIAGNOSIS — J90 Pleural effusion, not elsewhere classified: Secondary | ICD-10-CM

## 2020-04-23 DIAGNOSIS — Z950 Presence of cardiac pacemaker: Secondary | ICD-10-CM

## 2020-04-23 HISTORY — DX: Presence of cardiac pacemaker: Z95.0

## 2020-04-23 HISTORY — PX: INSERT / REPLACE / REMOVE PACEMAKER: SUR710

## 2020-09-30 ENCOUNTER — Emergency Department: Payer: Medicare Other

## 2020-09-30 ENCOUNTER — Other Ambulatory Visit: Payer: Self-pay

## 2020-09-30 ENCOUNTER — Inpatient Hospital Stay
Admission: EM | Admit: 2020-09-30 | Discharge: 2020-10-08 | DRG: 522 | Disposition: A | Discharge status: Skilled Nursing Facility | Payer: Medicare Other | Attending: Internal Medicine | Admitting: Internal Medicine

## 2020-09-30 DIAGNOSIS — K219 Gastro-esophageal reflux disease without esophagitis: Secondary | ICD-10-CM | POA: Diagnosis present

## 2020-09-30 DIAGNOSIS — I5020 Unspecified systolic (congestive) heart failure: Secondary | ICD-10-CM | POA: Diagnosis present

## 2020-09-30 DIAGNOSIS — Z90710 Acquired absence of both cervix and uterus: Secondary | ICD-10-CM

## 2020-09-30 DIAGNOSIS — Z6834 Body mass index (BMI) 34.0-34.9, adult: Secondary | ICD-10-CM | POA: Diagnosis not present

## 2020-09-30 DIAGNOSIS — S72012A Unspecified intracapsular fracture of left femur, initial encounter for closed fracture: Principal | ICD-10-CM | POA: Diagnosis present

## 2020-09-30 DIAGNOSIS — I130 Hypertensive heart and chronic kidney disease with heart failure and stage 1 through stage 4 chronic kidney disease, or unspecified chronic kidney disease: Secondary | ICD-10-CM | POA: Diagnosis present

## 2020-09-30 DIAGNOSIS — S72002A Fracture of unspecified part of neck of left femur, initial encounter for closed fracture: Secondary | ICD-10-CM | POA: Diagnosis present

## 2020-09-30 DIAGNOSIS — I4821 Permanent atrial fibrillation: Secondary | ICD-10-CM | POA: Diagnosis present

## 2020-09-30 DIAGNOSIS — Z79899 Other long term (current) drug therapy: Secondary | ICD-10-CM

## 2020-09-30 DIAGNOSIS — Z20822 Contact with and (suspected) exposure to covid-19: Secondary | ICD-10-CM | POA: Diagnosis present

## 2020-09-30 DIAGNOSIS — E669 Obesity, unspecified: Secondary | ICD-10-CM | POA: Diagnosis present

## 2020-09-30 DIAGNOSIS — I5022 Chronic systolic (congestive) heart failure: Secondary | ICD-10-CM | POA: Diagnosis present

## 2020-09-30 DIAGNOSIS — Z950 Presence of cardiac pacemaker: Secondary | ICD-10-CM

## 2020-09-30 DIAGNOSIS — D62 Acute posthemorrhagic anemia: Secondary | ICD-10-CM | POA: Diagnosis not present

## 2020-09-30 DIAGNOSIS — I4510 Unspecified right bundle-branch block: Secondary | ICD-10-CM | POA: Diagnosis present

## 2020-09-30 DIAGNOSIS — E785 Hyperlipidemia, unspecified: Secondary | ICD-10-CM | POA: Diagnosis present

## 2020-09-30 DIAGNOSIS — N1832 Chronic kidney disease, stage 3b: Secondary | ICD-10-CM | POA: Diagnosis present

## 2020-09-30 DIAGNOSIS — Z87891 Personal history of nicotine dependence: Secondary | ICD-10-CM

## 2020-09-30 DIAGNOSIS — R339 Retention of urine, unspecified: Secondary | ICD-10-CM | POA: Diagnosis present

## 2020-09-30 DIAGNOSIS — Z419 Encounter for procedure for purposes other than remedying health state, unspecified: Secondary | ICD-10-CM

## 2020-09-30 DIAGNOSIS — N179 Acute kidney failure, unspecified: Secondary | ICD-10-CM | POA: Diagnosis not present

## 2020-09-30 DIAGNOSIS — K5900 Constipation, unspecified: Secondary | ICD-10-CM | POA: Diagnosis not present

## 2020-09-30 DIAGNOSIS — Z885 Allergy status to narcotic agent status: Secondary | ICD-10-CM

## 2020-09-30 DIAGNOSIS — F32A Depression, unspecified: Secondary | ICD-10-CM | POA: Diagnosis present

## 2020-09-30 DIAGNOSIS — F419 Anxiety disorder, unspecified: Secondary | ICD-10-CM | POA: Diagnosis present

## 2020-09-30 DIAGNOSIS — Z79890 Hormone replacement therapy (postmenopausal): Secondary | ICD-10-CM

## 2020-09-30 DIAGNOSIS — E039 Hypothyroidism, unspecified: Secondary | ICD-10-CM | POA: Diagnosis present

## 2020-09-30 DIAGNOSIS — E860 Dehydration: Secondary | ICD-10-CM | POA: Diagnosis not present

## 2020-09-30 DIAGNOSIS — S72002D Fracture of unspecified part of neck of left femur, subsequent encounter for closed fracture with routine healing: Secondary | ICD-10-CM | POA: Diagnosis not present

## 2020-09-30 DIAGNOSIS — W010XXA Fall on same level from slipping, tripping and stumbling without subsequent striking against object, initial encounter: Secondary | ICD-10-CM | POA: Diagnosis present

## 2020-09-30 DIAGNOSIS — Z7901 Long term (current) use of anticoagulants: Secondary | ICD-10-CM

## 2020-09-30 DIAGNOSIS — Z882 Allergy status to sulfonamides status: Secondary | ICD-10-CM

## 2020-09-30 LAB — COMPREHENSIVE METABOLIC PANEL
ALT: 17 U/L (ref 0–44)
AST: 26 U/L (ref 15–41)
Albumin: 3.9 g/dL (ref 3.5–5.0)
Alkaline Phosphatase: 69 U/L (ref 38–126)
Anion gap: 8 (ref 5–15)
BUN: 31 mg/dL — ABNORMAL HIGH (ref 8–23)
CO2: 29 mmol/L (ref 22–32)
Calcium: 9 mg/dL (ref 8.9–10.3)
Chloride: 103 mmol/L (ref 98–111)
Creatinine, Ser: 1.36 mg/dL — ABNORMAL HIGH (ref 0.44–1.00)
GFR, Estimated: 38 mL/min — ABNORMAL LOW (ref >60)
Glucose, Bld: 99 mg/dL (ref 70–99)
Potassium: 3.6 mmol/L (ref 3.5–5.1)
Sodium: 140 mmol/L (ref 135–145)
Total Bilirubin: 0.8 mg/dL (ref 0.3–1.2)
Total Protein: 7.5 g/dL (ref 6.5–8.1)

## 2020-09-30 LAB — CBC WITH DIFFERENTIAL/PLATELET
Abs Immature Granulocytes: 0.05 10*3/uL (ref 0.00–0.07)
Basophils Absolute: 0 10*3/uL (ref 0.0–0.1)
Basophils Relative: 0 %
Eosinophils Absolute: 0.1 10*3/uL (ref 0.0–0.5)
Eosinophils Relative: 1 %
HCT: 37.6 % (ref 36.0–46.0)
Hemoglobin: 12.2 g/dL (ref 12.0–15.0)
Immature Granulocytes: 1 %
Lymphocytes Relative: 18 %
Lymphs Abs: 2 10*3/uL (ref 0.7–4.0)
MCH: 30.6 pg (ref 26.0–34.0)
MCHC: 32.4 g/dL (ref 30.0–36.0)
MCV: 94.2 fL (ref 80.0–100.0)
Monocytes Absolute: 0.5 10*3/uL (ref 0.1–1.0)
Monocytes Relative: 4 %
Neutro Abs: 8.3 10*3/uL — ABNORMAL HIGH (ref 1.7–7.7)
Neutrophils Relative %: 76 %
Platelets: 211 10*3/uL (ref 150–400)
RBC: 3.99 MIL/uL (ref 3.87–5.11)
RDW: 11.9 % (ref 11.5–15.5)
WBC: 11 10*3/uL — ABNORMAL HIGH (ref 4.0–10.5)
nRBC: 0 % (ref 0.0–0.2)

## 2020-09-30 LAB — PROTIME-INR
INR: 1.2 (ref 0.8–1.2)
Prothrombin Time: 14.8 seconds (ref 11.4–15.2)

## 2020-09-30 LAB — TYPE AND SCREEN
ABO/RH(D): O POS
Antibody Screen: NEGATIVE

## 2020-09-30 LAB — APTT: aPTT: 32 seconds (ref 24–36)

## 2020-09-30 MED ORDER — ACETAMINOPHEN 325 MG PO TABS
650.0000 mg | ORAL_TABLET | Freq: Four times a day (QID) | ORAL | Status: DC | PRN
  Administered 2020-10-02 – 2020-10-08 (×11): 650 mg via ORAL
  Filled 2020-09-30 (×11): qty 2

## 2020-09-30 MED ORDER — FENTANYL CITRATE (PF) 100 MCG/2ML IJ SOLN
50.0000 ug | INTRAMUSCULAR | Status: DC | PRN
  Administered 2020-09-30: 50 ug via INTRAVENOUS
  Filled 2020-09-30: qty 2

## 2020-09-30 MED ORDER — SENNA 8.6 MG PO TABS
1.0000 | ORAL_TABLET | Freq: Every day | ORAL | Status: DC
  Administered 2020-09-30 – 2020-10-06 (×7): 8.6 mg via ORAL
  Filled 2020-09-30 (×7): qty 1

## 2020-09-30 MED ORDER — SERTRALINE HCL 50 MG PO TABS
50.0000 mg | ORAL_TABLET | Freq: Every day | ORAL | Status: DC
  Administered 2020-10-01 – 2020-10-08 (×7): 50 mg via ORAL
  Filled 2020-09-30 (×7): qty 1

## 2020-09-30 MED ORDER — ADULT MULTIVITAMIN W/MINERALS CH
1.0000 | ORAL_TABLET | Freq: Every day | ORAL | Status: DC
  Administered 2020-09-30 – 2020-10-08 (×8): 1 via ORAL
  Filled 2020-09-30 (×8): qty 1

## 2020-09-30 MED ORDER — LEVOTHYROXINE SODIUM 50 MCG PO TABS
50.0000 ug | ORAL_TABLET | Freq: Every day | ORAL | Status: DC
  Administered 2020-10-01 – 2020-10-08 (×8): 50 ug via ORAL
  Filled 2020-09-30 (×8): qty 1

## 2020-09-30 MED ORDER — PROCHLORPERAZINE EDISYLATE 10 MG/2ML IJ SOLN
10.0000 mg | Freq: Four times a day (QID) | INTRAMUSCULAR | Status: DC | PRN
  Administered 2020-09-30: 10 mg via INTRAVENOUS
  Filled 2020-09-30 (×3): qty 2

## 2020-09-30 MED ORDER — MAGNESIUM OXIDE 400 (241.3 MG) MG PO TABS
400.0000 mg | ORAL_TABLET | Freq: Every day | ORAL | Status: AC
  Administered 2020-09-30 – 2020-10-06 (×6): 400 mg via ORAL
  Filled 2020-09-30 (×6): qty 1

## 2020-09-30 MED ORDER — SPIRONOLACTONE 25 MG PO TABS
25.0000 mg | ORAL_TABLET | Freq: Every day | ORAL | Status: DC
  Administered 2020-09-30: 25 mg via ORAL
  Filled 2020-09-30 (×2): qty 1

## 2020-09-30 MED ORDER — TORSEMIDE 20 MG PO TABS
20.0000 mg | ORAL_TABLET | Freq: Every day | ORAL | Status: DC
  Administered 2020-09-30 – 2020-10-08 (×8): 20 mg via ORAL
  Filled 2020-09-30 (×8): qty 1

## 2020-09-30 MED ORDER — FENTANYL CITRATE (PF) 100 MCG/2ML IJ SOLN
50.0000 ug | INTRAMUSCULAR | Status: DC | PRN
  Administered 2020-09-30 – 2020-10-02 (×7): 50 ug via INTRAVENOUS
  Filled 2020-09-30 (×7): qty 2

## 2020-09-30 MED ORDER — METOPROLOL SUCCINATE ER 50 MG PO TB24
50.0000 mg | ORAL_TABLET | Freq: Every day | ORAL | Status: DC
  Administered 2020-10-01 – 2020-10-08 (×8): 50 mg via ORAL
  Filled 2020-09-30 (×9): qty 1

## 2020-09-30 MED ORDER — OXYCODONE HCL 5 MG PO TABS
5.0000 mg | ORAL_TABLET | Freq: Four times a day (QID) | ORAL | Status: DC | PRN
  Administered 2020-09-30 – 2020-10-05 (×14): 5 mg via ORAL
  Filled 2020-09-30 (×15): qty 1

## 2020-09-30 MED ORDER — DILTIAZEM HCL ER COATED BEADS 120 MG PO CP24
120.0000 mg | ORAL_CAPSULE | Freq: Every day | ORAL | Status: DC
  Filled 2020-09-30: qty 1

## 2020-09-30 MED ORDER — POTASSIUM CHLORIDE 20 MEQ PO PACK
40.0000 meq | PACK | Freq: Every day | ORAL | Status: DC
  Administered 2020-09-30 – 2020-10-01 (×2): 40 meq via ORAL
  Filled 2020-09-30 (×2): qty 2

## 2020-09-30 NOTE — H&P (Addendum)
History and Physical  Heather [name]:[id number] DOB: [birth year] DOA: 09/30/2020  Referring physician: Dr. Cyril Loosen PCP: Barbette Reichmann, MD  Outpatient Specialists: Cardiology Patient coming from: Home.  Chief Complaint: Left hip pain.  HPI: Heather [name] is a 87 y.o. female with medical history significant for permanent A. fib on Eliquis, essential hypertension, hypothyroidism, hyperlipidemia, chronic anxiety/depression, who presented to [facility] ED from home due to severe left hip pain after a mechanical fall at home.  She was in her usual state of health prior to this.  She tripped on a piece of furniture in her room and landed on her left side.  Denies having any bump on her head or headache.  Thinks she hit her head.  Last dose of Eliquis was this morning.  Left hip x-ray showed moderately displaced proximal left femoral neck fracture.  EDP contacted orthopedic surgery Dr. Odis Luster who recommended holding off Eliquis for 3 days prior to surgical intervention.   ED Course: Afebrile, BP 154/64, pulse 70, respiration rate 32, O2 saturation 93% on room air.  Lab studies remarkable for creatinine 1.36 with baseline of 1.2, WBC 11.0K, hemoglobin 12.2K.  Review of Systems: Review of systems as noted in the HPI. All other systems reviewed and are negative.   Past Medical History:  Diagnosis Date  . Anxiety   . Atrial fibrillation (HCC)   . CHF (congestive heart failure) (HCC)   . GERD (gastroesophageal reflux disease)   . Hypertension   . Thyroid disease    Past Surgical History:  Procedure Laterality Date  . ABDOMINAL HYSTERECTOMY    . APPENDECTOMY      Social History:  reports that she has quit smoking. She has never used smokeless tobacco. She reports that she does not drink alcohol and does not use drugs.   Allergies  Allergen Reactions  . Other Shortness Of Breath    Other reaction(s): Other (See Comments) Pollen (wheezing,SOB)  . Codeine Other (See Comments)    Dizziness   - makes pt fell "drunk"  . Sulfa Antibiotics Other (See Comments)    Pt prefers not to take med  . Sulfamethoxazole-Trimethoprim     Other reaction(s): Other (See Comments)    Family History  Family history unknown: Yes  Parents were reportedly alcoholics.    Prior to Admission medications   Medication Sig Start Date End Date Taking? Authorizing Provider  apixaban (ELIQUIS) 5 MG TABS tablet Take by mouth. 07/02/16   [provider]  diltiazem (CARDIZEM CD) 120 MG 24 hr capsule  06/18/19   [provider]  levothyroxine (SYNTHROID) 50 MCG tablet TAKE 1 TABLET BY MOUTH EVERY DAY 06/21/17   [provider]  losartan (COZAAR) 25 MG tablet Take 25 mg by mouth 2 (two) times daily.     [provider]  Magnesium Gluconate 550 MG TABS Take by mouth.    [provider]  metoprolol succinate (TOPROL-XL) 50 MG 24 hr tablet Take by mouth. 08/29/19 08/28/20  [provider]  Multiple Vitamins-Minerals (MULTIVITAMIN WITH MINERALS) tablet Take 1 tablet by mouth daily.    [provider]  Potassium 99 MG TABS Take 1 tablet by mouth daily.    [provider]  sertraline (ZOLOFT) 50 MG tablet Take by mouth. 09/23/19 09/22/20  [provider]  spironolactone (ALDACTONE) 25 MG tablet  08/25/19   [provider]  torsemide (DEMADEX) 20 MG tablet Take by mouth. 08/29/19 08/28/20  [provider]    Physical Exam: BP  137/63   Pulse 70   Temp 98.2 F (36.8 C) (Oral)   Resp (!) 27   Ht 5\' 5"  (1.651 m)   Wt 90.7 kg   SpO2 96%   BMI 33.28 kg/m   . General: 87 y.o. year-old female well developed well nourished in no acute distress.  Alert and oriented x3. . Cardiovascular: Irregular rate and rhythm with no rubs or gallops.  No thyromegaly or JVD noted.  No lower extremity edema. 2/4 pulses in all 4 extremities. 88 Respiratory: Clear to auscultation with no wheezes or rales. Good inspiratory effort. . Abdomen: Soft  nontender nondistended with normal bowel sounds x4 quadrants. . Muskuloskeletal: No cyanosis, clubbing or edema noted bilaterally . Neuro: CN II-XII intact, strength, sensation, reflexes . Skin: No ulcerative lesions noted or rashes . Psychiatry: Judgement and insight appear normal. Mood is appropriate for condition and setting          Labs on Admission:  Basic Metabolic Panel: Recent Labs  Lab 09/30/20 1742  NA 140  K 3.6  CL 103  CO2 29  GLUCOSE 99  BUN 31*  CREATININE 1.36*  CALCIUM 9.0   Liver Function Tests: Recent Labs  Lab 09/30/20 1742  AST 26  ALT 17  ALKPHOS 69  BILITOT 0.8  PROT 7.5  ALBUMIN 3.9   No results for input(s): LIPASE, AMYLASE in the last 168 hours. No results for input(s): AMMONIA in the last 168 hours. CBC: Recent Labs  Lab 09/30/20 1742  WBC 11.0*  NEUTROABS 8.3*  HGB 12.2  HCT 37.6  MCV 94.2  PLT 211   Cardiac Enzymes: No results for input(s): CKTOTAL, CKMB, CKMBINDEX, TROPONINI in the last 168 hours.  BNP (last 3 results) No results for input(s): BNP in the last 8760 hours.  ProBNP (last 3 results) No results for input(s): PROBNP in the last 8760 hours.  CBG: No results for input(s): GLUCAP in the last 168 hours.  Radiological Exams on Admission: DG Chest 1 View  Result Date: 09/30/2020 CLINICAL DATA:  Preop.  Left hip fracture. EXAM: CHEST  1 VIEW COMPARISON:  June 30, 2016. FINDINGS: Mild cardiomegaly is noted. Both lungs are clear. Left-sided pacemaker is noted in grossly good position. The visualized skeletal structures are unremarkable. IMPRESSION: No active disease. Electronically Signed   By: July 02, 2016 M.D.   On: 09/30/2020 18:44   DG Hip Unilat With Pelvis 2-3 Views Left  Result Date: 09/30/2020 CLINICAL DATA:  Left hip pain after fall. EXAM: DG HIP (WITH OR WITHOUT PELVIS) 2-3V LEFT COMPARISON:  None. FINDINGS: Moderately displaced proximal left femoral neck fracture is noted. IMPRESSION: Moderately  displaced proximal left femoral neck fracture. Electronically Signed   By: 10/02/2020 M.D.   On: 09/30/2020 18:43    EKG: I independently viewed the EKG done and my findings are as followed: Accelerated junctional rhythm rate of 70.  QTc 560.  Assessment/Plan Present on Admission: . (Resolved) Closed left hip fracture, initial encounter (HCC) . Fracture of femoral neck, left (HCC)  Active Problems:   Fracture of femoral neck, left (HCC)  Moderately displaced proximal left femoral neck fracture post mechanical fall at home Left hip x-ray showed moderately displaced proximal left femoral neck fracture.   EDP contacted orthopedic surgery, Dr. 10/02/2020, who recommended holding off Eliquis for 3 days prior to surgical intervention.  Pain control Management per orthopedic surgery.  Permanent atrial fibrillation on Eliquis Last dose of Eliquis was on 09/30/2020 morning. Continue to hold  off Eliquis as recommended by orthopedic surgery. She is currently rate controlled on home p.o. diltiazem and Toprol-XL. Monitor on telemetry.  Chronic systolic CHF/HFREF 40-45% Euvolemic on exam 2D echo done on 06/29/2016 showed LVEF 40 to 45% Resume home regimen Monitor strict I's and O's and daily weight.  Prolonged QTC Presented with QTC greater than 500 Optimize potassium and magnesium levels Avoid QTC prolonging agents Repeat twelve-lead EKG in the morning.  Hypothyroidism Obtain TSH Resume home levothyroxine  Chronic anxiety/depression Stable Resume home Zoloft  CKD 3B Appears to be close to her baseline creatinine 1.3 with GFR 38. Avoid nephrotoxic agents. Monitor urine output Repeat renal panel in the morning.  Mild leukocytosis likely reactive in the setting of acute fracture Afebrile, nontoxic-appearing. WBC 11 K, monitor.      DVT prophylaxis: SCDs.  Code Status: Full code as stated by the patient herself.  Family Communication: Daughter at bedside.  Disposition  Plan: Admit to MedSurg with remote telemetry.  Consults called: Orthopedic surgery consulted by EDP.  Admission status: Inpatient status.  Patient will require at least 2 midnight for further evaluation and treatment of present condition.   Status is: Inpatient    Dispo:  Patient From: Home  Planned Disposition: Home with Health Care Svc  Anticipated discharge date 10/05/2020 or when orthopedic surgery signs off.  Medically stable for discharge: No, ongoing management of acute left femoral neck fracture.         Darlin Drop MD [facility] Pager [phone]  If 7PM-7AM, please contact night-coverage [email or web address] Password [facility]  09/30/2020, 7:24 PM

## 2020-09-30 NOTE — ED Notes (Signed)
XR at bedside

## 2020-09-30 NOTE — ED Notes (Signed)
Pt repositioned in bed for comfort, L knee elevated on pillow. Pt and daughter updated on POC.

## 2020-09-30 NOTE — ED Provider Notes (Signed)
[facility] Emergency Department Provider Note   ____________________________________________    I have reviewed the triage vital signs and the nursing notes.   HISTORY  Chief Complaint Fall and Leg Injury     HPI Heather [name] is a 87 y.o. female with history of atrial fibrillation on Eliquis who presents after a mechanical fall.  Patient reports she tripped and fell just prior to arrival, injuring her left hip in the process.  She denies serious head injury.  No neck pain.  No abdominal pain, no chest pain or palpitations.  Complains primarily in pain in her left groin radiating towards her left knee.  No upper extremity injuries.  She did take her Eliquis this morning  Past Medical History:  Diagnosis Date  . Anxiety   . Atrial fibrillation (HCC)   . CHF (congestive heart failure) (HCC)   . GERD (gastroesophageal reflux disease)   . Hypertension   . Thyroid disease     Patient Active Problem List   Diagnosis Date Noted  . Macular degeneration of both eyes 06/27/2018  . Moderate episode of recurrent major depressive disorder (HCC) 06/27/2018  . Depression 10/16/2017  . Permanent atrial fibrillation (HCC) 09/26/2017  . Benign essential HTN 03/27/2017  . Heart failure with reduced ejection fraction (HCC) 07/19/2016  . Hypertensive heart disease with congestive heart failure (HCC) 07/19/2016  . CAP (community acquired pneumonia) 06/28/2016  . Anxiety 06/28/2016  . HTN (hypertension) 06/28/2016  . Hypothyroidism 06/28/2016  . GERD (gastroesophageal reflux disease) 06/28/2016  . Cough 03/28/2015  . Hyperlipidemia 02/10/2013  . Menopausal syndrome 02/10/2013  . Obesity 02/10/2013    Past Surgical History:  Procedure Laterality Date  . ABDOMINAL HYSTERECTOMY    . APPENDECTOMY      Prior to Admission medications   Medication Sig Start Date End Date Taking? Authorizing Provider  apixaban (ELIQUIS) 5 MG TABS tablet Take by mouth.  07/02/16   [provider]  diltiazem (CARDIZEM CD) 120 MG 24 hr capsule  06/18/19   [provider]  levothyroxine (SYNTHROID) 50 MCG tablet TAKE 1 TABLET BY MOUTH EVERY DAY 06/21/17   [provider]  losartan (COZAAR) 25 MG tablet Take 25 mg by mouth 2 (two) times daily.     [provider]  Magnesium Gluconate 550 MG TABS Take by mouth.    [provider]  metoprolol succinate (TOPROL-XL) 50 MG 24 hr tablet Take by mouth. 08/29/19 08/28/20  [provider]  Multiple Vitamins-Minerals (MULTIVITAMIN WITH MINERALS) tablet Take 1 tablet by mouth daily.    [provider]  Potassium 99 MG TABS Take 1 tablet by mouth daily.    [provider]  sertraline (ZOLOFT) 50 MG tablet Take by mouth. 09/23/19 09/22/20  [provider]  spironolactone (ALDACTONE) 25 MG tablet  08/25/19   [provider]  torsemide (DEMADEX) 20 MG tablet Take by mouth. 08/29/19 08/28/20  [provider]     Allergies Other, Codeine, Sulfa antibiotics, and Sulfamethoxazole-trimethoprim  Family History  Family history unknown: Yes    Social History Social History   Tobacco Use  . Smoking status: Former Games developer  . Smokeless tobacco: Never Used  Substance Use Topics  . Alcohol use: No  . Drug use: No    Review of Systems  Constitutional: No fever/chills Eyes: No visual changes.  ENT: No sore throat. Cardiovascular: Denies chest pain. Respiratory: Denies shortness of breath. Gastrointestinal: No abdominal pain.  No nausea, no vomiting.  Genitourinary: Negative for dysuria. Musculoskeletal: As above Skin: Negative for rash. Neurological: Negative for headaches, no numbness reported   ____________________________________________   PHYSICAL EXAM:  VITAL SIGNS: ED Triage Vitals  Enc Vitals Group     BP 09/30/20 1736 (!) 144/76     Pulse Rate 09/30/20 1736 75     Resp 09/30/20 1736 16     Temp 09/30/20 1756 98.2 F  (36.8 C)     Temp Source 09/30/20 1756 Oral     SpO2 09/30/20 1736 99 %     Weight 09/30/20 1728 90.7 kg (200 lb)     Height 09/30/20 1728 1.651 m (5\' 5" )     Head Circumference --      Peak Flow --      Pain Score 09/30/20 1727 7     Pain Loc --      Pain Edu? --      Excl. in GC? --     Constitutional: Alert and oriented. No acute distress.  Eyes: Conjunctivae are normal.  Head: Atraumatic. Nose: No congestion/rhinnorhea. Mouth/Throat: Mucous membranes are moist.   Neck:  Painless ROM, no pain with axial load, no vertebral palpation Cardiovascular: Regular rhythm, grossly normal heart sounds.  Good peripheral circulation. Respiratory: Normal respiratory effort.  No retractions. Lungs CTAB. Gastrointestinal: Soft and nontender. No distention.  No CVA tenderness. Genitourinary: deferred Musculoskeletal: Left leg kept flexed at the hip, pain with axial load, 2+ distal pulses Neurologic:  Normal speech and language. No gross focal neurologic deficits are appreciated.  Skin:  Skin is warm, dry and intact. No rash noted. Psychiatric: Mood and affect are normal. Speech and behavior are normal.  ____________________________________________   LABS (all labs ordered are listed, but only abnormal results are displayed)  Labs Reviewed  CBC WITH DIFFERENTIAL/PLATELET - Abnormal; Notable for the following components:      Result Value   WBC 11.0 (*)    Neutro Abs 8.3 (*)    All other components within normal limits  COMPREHENSIVE METABOLIC PANEL - Abnormal; Notable for the following components:   BUN 31 (*)    Creatinine, Ser 1.36 (*)    GFR, Estimated 38 (*)    All other components within normal limits  PROTIME-INR  APTT  TYPE AND SCREEN   ____________________________________________  EKG  ED ECG REPORT I, 10/02/20, the attending physician, personally viewed and interpreted this ECG.  Date: 09/30/2020  Rhythm: Paced rhythm QRS Axis: normal Intervals:  Abnormal ST/T Wave abnormalities: normal Narrative Interpretation: no evidence of acute ischemia  ____________________________________________  RADIOLOGY  Hip x-ray reviewed by me, consistent with left hip fracture ____________________________________________   PROCEDURES  Procedure(s) performed: No  Procedures   Critical Care performed: No ____________________________________________   INITIAL IMPRESSION / ASSESSMENT AND PLAN / ED COURSE  Pertinent labs & imaging results that were available during my care of the patient were reviewed by me and considered in my medical decision making (see chart for details).  Patient presents after mechanical fall planing of left hip pain as above, no other injuries reported.  She is on Eliquis, did take her dose this morning.  IV fentanyl ordered, IV Zofran as needed.  X-rays pending  Hip x-ray is consistent with left hip fracture, discussed with Dr. 10/02/2020 of orthopedic surgery, will require admission to the hospitalist team    ____________________________________________   FINAL CLINICAL IMPRESSION(S) / ED DIAGNOSES  Final diagnoses:  Closed fracture of left hip, initial encounter (HCC)  Note:  This document was prepared using Dragon voice recognition software and may include unintentional dictation errors.   Jene Every, MD 09/30/20 [phone]

## 2020-09-30 NOTE — ED Notes (Signed)
Family at bedside. 

## 2020-09-30 NOTE — ED Triage Notes (Addendum)
Pt to ED via [facility] from home for mechanical fall with L leg injury/pain. Pt tripped on furniture and fell. Pt states hit head but no LOC. Pt c/o L leg and hip pain. Leg was twisted under R leg as fell. Pt alert and oriented, placed on 2L oxygen by EMS when SPO2 was 89% on RA. EMS says VS and SPO2 normal on ride to hospital. Pt was given fentanyl IV by EMS. Outward rotation of L leg noted. Pt unwilling to move L leg due to pain. Pt has implanted pacemaker and has a fib. Pt takes Eliquis.

## 2020-09-30 NOTE — Consult Note (Signed)
Full consult note to follow. Patient will need to be off of Eliquis for 3 days prior to left hip hemiarthroplasty, likely Monday 3/21.

## 2020-10-01 ENCOUNTER — Other Ambulatory Visit: Payer: Self-pay

## 2020-10-01 ENCOUNTER — Encounter: Payer: Self-pay | Admitting: Internal Medicine

## 2020-10-01 DIAGNOSIS — S72002D Fracture of unspecified part of neck of left femur, subsequent encounter for closed fracture with routine healing: Secondary | ICD-10-CM

## 2020-10-01 LAB — BASIC METABOLIC PANEL
Anion gap: 8 (ref 5–15)
BUN: 28 mg/dL — ABNORMAL HIGH (ref 8–23)
CO2: 28 mmol/L (ref 22–32)
Calcium: 9 mg/dL (ref 8.9–10.3)
Chloride: 101 mmol/L (ref 98–111)
Creatinine, Ser: 1.21 mg/dL — ABNORMAL HIGH (ref 0.44–1.00)
GFR, Estimated: 44 mL/min — ABNORMAL LOW (ref >60)
Glucose, Bld: 122 mg/dL — ABNORMAL HIGH (ref 70–99)
Potassium: 4.2 mmol/L (ref 3.5–5.1)
Sodium: 137 mmol/L (ref 135–145)

## 2020-10-01 LAB — CBC
HCT: 33.8 % — ABNORMAL LOW (ref 36.0–46.0)
Hemoglobin: 11.2 g/dL — ABNORMAL LOW (ref 12.0–15.0)
MCH: 30.9 pg (ref 26.0–34.0)
MCHC: 33.1 g/dL (ref 30.0–36.0)
MCV: 93.1 fL (ref 80.0–100.0)
Platelets: 194 10*3/uL (ref 150–400)
RBC: 3.63 MIL/uL — ABNORMAL LOW (ref 3.87–5.11)
RDW: 12 % (ref 11.5–15.5)
WBC: 13.1 10*3/uL — ABNORMAL HIGH (ref 4.0–10.5)
nRBC: 0 % (ref 0.0–0.2)

## 2020-10-01 LAB — PHOSPHORUS: Phosphorus: 3 mg/dL (ref 2.5–4.6)

## 2020-10-01 LAB — MAGNESIUM: Magnesium: 2.2 mg/dL (ref 1.7–2.4)

## 2020-10-01 LAB — SARS CORONAVIRUS 2 (TAT 6-24 HRS): SARS Coronavirus 2: NEGATIVE

## 2020-10-01 LAB — TSH: TSH: 3.857 u[IU]/mL (ref 0.350–4.500)

## 2020-10-01 MED ORDER — POTASSIUM CHLORIDE CRYS ER 10 MEQ PO TBCR
10.0000 meq | EXTENDED_RELEASE_TABLET | Freq: Every day | ORAL | Status: DC
  Administered 2020-10-02 – 2020-10-08 (×6): 10 meq via ORAL
  Filled 2020-10-01 (×6): qty 1

## 2020-10-01 MED ORDER — CHLORHEXIDINE GLUCONATE CLOTH 2 % EX PADS
6.0000 | MEDICATED_PAD | Freq: Every day | CUTANEOUS | Status: DC
  Administered 2020-10-03 – 2020-10-08 (×2): 6 via TOPICAL

## 2020-10-01 NOTE — ED Notes (Signed)
MD notified of pts request to have indwelling cath instead of I/o cath prn. MD also notified of difficulty and pain involved in I/O cathing pt. Asked for an order to place indwelling cath.

## 2020-10-01 NOTE — ED Notes (Signed)
Pt pulled up in bed and set up for lunch - lunch tray given

## 2020-10-01 NOTE — Progress Notes (Addendum)
Progress Note    [name]  YKZ:[id number] DOB: [birth year]  DOA: 09/30/2020 PCP: Barbette Reichmann, MD      Brief Narrative:    Medical records reviewed and are as summarized below:  Heather [name] is a 87 y.o. female is an 87 year old woman with medical history significant for atrial fibrillation on Eliquis, hypertension, hypothyroidism, hyperlipidemia, anxiety, depression, who presented to the hospital because of severe left hip pain following a mechanical fall at home.  She was found to have closed left hip fracture.      Assessment/Plan:   Active Problems:   Fracture of femoral neck, left (HCC)   Body mass index is 33.28 kg/m.  (Obesity)   Closed left hip fracture: Analgesics as needed for pain.  Plan for surgical repair on 10/04/2020  Acute urinary retention: Patient had in and out catheter x3.  Insert Foley catheter.  Permanent atrial fibrillation: Eliquis has been held for upcoming surgery.  Continue Toprol.  Patient said she was not taking Cardizem at home.  Chronic systolic CHF (EF 40 to 45% December 2017): Continue torsemide  Prolonged QTc interval: Improved 560 to 543.  This is likely due to right bundle branch block on EKG.   Other comorbidities include status post permanent pacemaker, hypothyroidism, anxiety, depression, hyperlipidemia, hypertension, CKD stage IIIb  Diet Order            Diet Heart Room service appropriate? Yes; Fluid consistency: Thin  Diet effective now                    Consultants:  Orthopedic surgeon  Procedures:  None    Medications:   . levothyroxine  50 mcg Oral Daily  . magnesium oxide  400 mg Oral Daily  . metoprolol succinate  50 mg Oral Daily  . multivitamin with minerals  1 tablet Oral Daily  . potassium chloride  10 mEq Oral Daily  . senna  1 tablet Oral QHS  . sertraline  50 mg Oral Daily  . torsemide  20 mg Oral Daily   Continuous Infusions:   Anti-infectives (From admission,  onward)   None             Family Communication/Anticipated D/C date and plan/Code Status   DVT prophylaxis: Place and maintain sequential compression device Start: 09/30/20 1953     Code Status: Full Code  Family Communication: Lupita Leash, daughter Disposition Plan:    Status is: Inpatient  Remains inpatient appropriate because:Unsafe d/c plan and Inpatient level of care appropriate due to severity of illness   Dispo:  Patient From: Home  Planned Disposition: SNF  Medically stable for discharge: No                Subjective:   C/o left hip pain and drowsiness from pain medicine  Objective:    Vitals:   09/30/20 2100 10/01/20 0030 10/01/20 0400 10/01/20 0600  BP: (!) 154/85 (!) 163/71 (!) 163/88 (!) 171/75  Pulse: 72 70 70 70  Resp: (!) 22 19 (!) 29 (!) 21  Temp:      TempSrc:      SpO2: 96% 97% 99% 99%  Weight:      Height:       No data found.   Intake/Output Summary (Last 24 hours) at 10/01/2020 1049 Last data filed at 10/01/2020 1021 Gross per 24 hour  Intake --  Output 1850 ml  Net -1850 ml   Filed Weights   09/30/20 1728  Weight: 90.7 kg    Exam:  GEN: NAD SKIN: No rash EYES: EOMI ENT: MMM CV: RRR.  Pacemaker in left upper chest PULM: CTA B ABD: soft, obese, NT, +BS CNS: AAO x 3, non focal EXT: Left hip tenderness        Data Reviewed:   I have personally reviewed following labs and imaging studies:  Labs: Labs show the following:   Basic Metabolic Panel: Recent Labs  Lab 09/30/20 1742 10/01/20 0524  NA 140 137  K 3.6 4.2  CL 103 101  CO2 29 28  GLUCOSE 99 122*  BUN 31* 28*  CREATININE 1.36* 1.21*  CALCIUM 9.0 9.0  MG  --  2.2  PHOS  --  3.0   GFR Estimated Creatinine Clearance: 37.1 mL/min (A) (by C-G formula based on SCr of 1.21 mg/dL (H)). Liver Function Tests: Recent Labs  Lab 09/30/20 1742  AST 26  ALT 17  ALKPHOS 69  BILITOT 0.8  PROT 7.5  ALBUMIN 3.9   No results for input(s):  LIPASE, AMYLASE in the last 168 hours. No results for input(s): AMMONIA in the last 168 hours. Coagulation profile Recent Labs  Lab 09/30/20 1742  INR 1.2    CBC: Recent Labs  Lab 09/30/20 1742 10/01/20 0634  WBC 11.0* 13.1*  NEUTROABS 8.3*  --   HGB 12.2 11.2*  HCT 37.6 33.8*  MCV 94.2 93.1  PLT 211 194   Cardiac Enzymes: No results for input(s): CKTOTAL, CKMB, CKMBINDEX, TROPONINI in the last 168 hours. BNP (last 3 results) No results for input(s): PROBNP in the last 8760 hours. CBG: No results for input(s): GLUCAP in the last 168 hours. D-Dimer: No results for input(s): DDIMER in the last 72 hours. Hgb A1c: No results for input(s): HGBA1C in the last 72 hours. Lipid Profile: No results for input(s): CHOL, HDL, LDLCALC, TRIG, CHOLHDL, LDLDIRECT in the last 72 hours. Thyroid function studies: Recent Labs    10/01/20 0524  TSH 3.857   Anemia work up: No results for input(s): VITAMINB12, FOLATE, FERRITIN, TIBC, IRON, RETICCTPCT in the last 72 hours. Sepsis Labs: Recent Labs  Lab 09/30/20 1742 10/01/20 0634  WBC 11.0* 13.1*    Microbiology Recent Results (from the past 240 hour(s))  SARS CORONAVIRUS 2 (TAT 6-24 HRS) Nasopharyngeal Nasopharyngeal Swab     Status: None   Collection Time: 10/01/20  1:32 AM   Specimen: Nasopharyngeal Swab  Result Value Ref Range Status   SARS Coronavirus 2 NEGATIVE NEGATIVE Final    Comment: (NOTE) SARS-CoV-2 target nucleic acids are NOT DETECTED.  The SARS-CoV-2 RNA is generally detectable in upper and lower respiratory specimens during the acute phase of infection. Negative results do not preclude SARS-CoV-2 infection, do not rule out co-infections with other pathogens, and should not be used as the sole basis for treatment or other patient management decisions. Negative results must be combined with clinical observations, patient history, and epidemiological information. The expected result is Negative.  Fact Sheet for  Patients: HairSlick.no  Fact Sheet for Healthcare Providers: quierodirigir.com  This test is not yet approved or cleared by the [place] FDA and  has been authorized for detection and/or diagnosis of SARS-CoV-2 by FDA under an Emergency Use Authorization (EUA). This EUA will remain  in effect (meaning this test can be used) for the duration of the COVID-19 declaration under Se ction 564(b)(1) of the Act, 21 U.S.C. section 360bbb-3(b)(1), unless the authorization is terminated or revoked sooner.  Performed at [facility], [address], [place], [place] [address]     Procedures and diagnostic studies:  DG Chest 1 View  Result Date: 09/30/2020 CLINICAL DATA:  Preop.  Left hip fracture. EXAM: CHEST  1 VIEW COMPARISON:  June 30, 2016. FINDINGS: Mild cardiomegaly is noted. Both lungs are clear. Left-sided pacemaker is noted in grossly good position. The visualized skeletal structures are unremarkable. IMPRESSION: No active disease. Electronically Signed   By: Lupita Raider M.D.   On: 09/30/2020 18:44   DG Hip Unilat With Pelvis 2-3 Views Left  Result Date: 09/30/2020 CLINICAL DATA:  Left hip pain after fall. EXAM: DG HIP (WITH OR WITHOUT PELVIS) 2-3V LEFT COMPARISON:  None. FINDINGS: Moderately displaced proximal left femoral neck fracture is noted. IMPRESSION: Moderately displaced proximal left femoral neck fracture. Electronically Signed   By: Lupita Raider M.D.   On: 09/30/2020 18:43               LOS: 1 day   Kassi Esteve  [facility]   Pager on [email or web address]. If 7PM-7AM, please contact night-coverage at [email or web address]     10/01/2020, 10:49 AM

## 2020-10-01 NOTE — Consult Note (Signed)
ORTHOPAEDIC CONSULTATION  REQUESTING PHYSICIAN: Lurene Shadow, MD  Chief Complaint: left hip pain  HPI: Heather [name] is a 87 y.o. female with medical history significant for permanent A. fib on Eliquis, essential hypertension, hypothyroidism, hyperlipidemia, chronic anxiety/depression, who presented to [facility] ED from home due to severe left hip pain after a mechanical fall at home.  She was in her usual state of health prior to this.  She tripped on a piece of furniture in her room and landed on her left side.  Denies having any bump on her head or headache.  Thinks she hit her head.  Last dose of Eliquis was this morning.  Left hip x-ray showed moderately displaced proximal left femoral neck fracture.  EDP contacted orthopedic surgery Dr. Odis Luster who recommended holding off Eliquis for 3 days prior to surgical intervention. . Please see H&P and ED notes for details. Denies any numbness, tingling or constitutional symptoms.  Past Medical History:  Diagnosis Date  . Anxiety   . Atrial fibrillation (HCC)   . CHF (congestive heart failure) (HCC)   . GERD (gastroesophageal reflux disease)   . Hypertension   . Thyroid disease    Past Surgical History:  Procedure Laterality Date  . ABDOMINAL HYSTERECTOMY    . APPENDECTOMY     Social History   Socioeconomic History  . Marital status: Widowed    Spouse name: Not on file  . Number of children: Not on file  . Years of education: Not on file  . Highest education level: Not on file  Occupational History  . Not on file  Tobacco Use  . Smoking status: Former Games developer  . Smokeless tobacco: Never Used  Substance and Sexual Activity  . Alcohol use: No  . Drug use: No  . Sexual activity: Yes    Birth control/protection: Post-menopausal  Other Topics Concern  . Not on file  Social History Narrative  . Not on file   Social Determinants of Health   Financial Resource Strain: Not on file  Food Insecurity: Not on file  Transportation  Needs: Not on file  Physical Activity: Not on file  Stress: Not on file  Social Connections: Not on file   Family History  Family history unknown: Yes   Allergies  Allergen Reactions  . Other Shortness Of Breath    Other reaction(s): Other (See Comments) Pollen (wheezing,SOB)  . Codeine Other (See Comments)    Dizziness  - makes pt fell "drunk"  . Sulfa Antibiotics Other (See Comments)    Pt prefers not to take med  . Sulfamethoxazole-Trimethoprim     Other reaction(s): Other (See Comments)   Prior to Admission medications   Medication Sig Start Date End Date Taking? Authorizing Provider  apixaban (ELIQUIS) 5 MG TABS tablet Take by mouth. 07/02/16  Yes [provider]  levothyroxine (SYNTHROID) 50 MCG tablet TAKE 1 TABLET BY MOUTH EVERY DAY 06/21/17  Yes [provider]  losartan (COZAAR) 50 MG tablet Take 50 mg by mouth daily. 08/04/20  Yes [provider]  Multiple Vitamins-Minerals (MULTIVITAMIN ADULT, MINERALS, PO) Take 1 tablet by mouth daily.   Yes [provider]  potassium chloride (KLOR-CON) 10 MEQ tablet Take 10 mEq by mouth daily. 09/27/20 09/27/21 Yes [provider]  sertraline (ZOLOFT) 50 MG tablet Take 50 mg by mouth at bedtime. 09/23/19 10/01/20 Yes [provider]  torsemide (DEMADEX) 20 MG tablet Take 20 mg by mouth daily. 08/29/19 10/01/20 Yes [provider]  Magnesium Gluconate 550  MG TABS Take by mouth. Patient not taking: Reported on 10/01/2020    [provider]  meclizine (ANTIVERT) 12.5 MG tablet Take 12.5 mg by mouth 3 (three) times daily as needed. Patient not taking: Reported on 10/01/2020 07/27/20   [provider]  metoprolol succinate (TOPROL-XL) 50 MG 24 hr tablet Take 50 mg by mouth daily. 08/29/19 08/28/20  [provider]   DG Chest 1 View  Result Date: 09/30/2020 CLINICAL DATA:  Preop.  Left hip fracture. EXAM: CHEST  1 VIEW COMPARISON:  June 30, 2016. FINDINGS:  Mild cardiomegaly is noted. Both lungs are clear. Left-sided pacemaker is noted in grossly good position. The visualized skeletal structures are unremarkable. IMPRESSION: No active disease. Electronically Signed   By: Lupita Raider M.D.   On: 09/30/2020 18:44   DG Hip Unilat With Pelvis 2-3 Views Left  Result Date: 09/30/2020 CLINICAL DATA:  Left hip pain after fall. EXAM: DG HIP (WITH OR WITHOUT PELVIS) 2-3V LEFT COMPARISON:  None. FINDINGS: Moderately displaced proximal left femoral neck fracture is noted. IMPRESSION: Moderately displaced proximal left femoral neck fracture. Electronically Signed   By: Lupita Raider M.D.   On: 09/30/2020 18:43    Positive ROS: All other systems have been reviewed and were otherwise negative with the exception of those mentioned in the HPI and as above.  Physical Exam: General: Alert, no acute distress Cardiovascular: No pedal edema Respiratory: No cyanosis, no use of accessory musculature GI: No organomegaly, abdomen is soft and non-tender Skin: No lesions in the area of chief complaint Neurologic: Sensation intact distally Psychiatric: Patient is competent for consent with normal mood and affect Lymphatic: No axillary or cervical lymphadenopathy  MUSCULOSKELETAL:  Compartments soft. Good cap refill. Motor and sensory intact distally.  Assessment: Moderately displaced proximal left femoral neck fracture.  Plan: Patient will need to be off of Eliquis for 3 days prior to left hip hemiarthroplasty, likely Monday 3/21. Admission per hospitalist NPO after midnight 3/20   Altamese Cabal, PA-C    10/01/2020 8:22 AM

## 2020-10-02 DIAGNOSIS — I4821 Permanent atrial fibrillation: Secondary | ICD-10-CM

## 2020-10-02 DIAGNOSIS — I5020 Unspecified systolic (congestive) heart failure: Secondary | ICD-10-CM

## 2020-10-02 LAB — CBC WITH DIFFERENTIAL/PLATELET
Abs Immature Granulocytes: 0.04 10*3/uL (ref 0.00–0.07)
Basophils Absolute: 0.1 10*3/uL (ref 0.0–0.1)
Basophils Relative: 0 %
Eosinophils Absolute: 0.6 10*3/uL — ABNORMAL HIGH (ref 0.0–0.5)
Eosinophils Relative: 5 %
HCT: 33 % — ABNORMAL LOW (ref 36.0–46.0)
Hemoglobin: 11.1 g/dL — ABNORMAL LOW (ref 12.0–15.0)
Immature Granulocytes: 0 %
Lymphocytes Relative: 15 %
Lymphs Abs: 1.8 10*3/uL (ref 0.7–4.0)
MCH: 31.4 pg (ref 26.0–34.0)
MCHC: 33.6 g/dL (ref 30.0–36.0)
MCV: 93.5 fL (ref 80.0–100.0)
Monocytes Absolute: 0.7 10*3/uL (ref 0.1–1.0)
Monocytes Relative: 6 %
Neutro Abs: 8.8 10*3/uL — ABNORMAL HIGH (ref 1.7–7.7)
Neutrophils Relative %: 74 %
Platelets: 177 10*3/uL (ref 150–400)
RBC: 3.53 MIL/uL — ABNORMAL LOW (ref 3.87–5.11)
RDW: 12.3 % (ref 11.5–15.5)
WBC: 12 10*3/uL — ABNORMAL HIGH (ref 4.0–10.5)
nRBC: 0 % (ref 0.0–0.2)

## 2020-10-02 LAB — BASIC METABOLIC PANEL
Anion gap: 8 (ref 5–15)
BUN: 28 mg/dL — ABNORMAL HIGH (ref 8–23)
CO2: 28 mmol/L (ref 22–32)
Calcium: 9 mg/dL (ref 8.9–10.3)
Chloride: 98 mmol/L (ref 98–111)
Creatinine, Ser: 1.35 mg/dL — ABNORMAL HIGH (ref 0.44–1.00)
GFR, Estimated: 38 mL/min — ABNORMAL LOW (ref >60)
Glucose, Bld: 114 mg/dL — ABNORMAL HIGH (ref 70–99)
Potassium: 4.2 mmol/L (ref 3.5–5.1)
Sodium: 134 mmol/L — ABNORMAL LOW (ref 135–145)

## 2020-10-02 NOTE — Progress Notes (Addendum)
Progress Note    Heather [name]  [id number]:[id number] DOB: [birth year]  DOA: 09/30/2020 PCP: Barbette Reichmann, MD      Brief Narrative:    Medical records reviewed and are as summarized below:  Heather [name] is a 87 y.o. female is an 87 year old woman with medical history significant for atrial fibrillation on Eliquis, hypertension, hypothyroidism, hyperlipidemia, anxiety, depression, who presented to the hospital because of severe left hip pain following a mechanical fall at home.  She was found to have closed left hip fracture.      Assessment/Plan:   Principal Problem:   Fracture of femoral neck, left (HCC) Active Problems:   Heart failure with reduced ejection fraction (HCC)   Permanent atrial fibrillation (HCC)   Body mass index is 34.26 kg/m.  (Obesity)   Closed left hip fracture: Continue analgesics for pain.  Plan for surgical repair on 10/05/2018.    Acute urinary retention: Foley catheter in place  Permanent atrial fibrillation: Eliquis has been held for upcoming surgery.  Continue Toprol.  Chronic systolic CHF (EF 40 to 45% December 2017): Continue torsemide  Prolonged QTc interval: Improved 560 to 543.  This is likely due to ventricular paced rhythm on EKG.  Other comorbidities include status post permanent pacemaker, hypothyroidism, anxiety, depression, hyperlipidemia, hypertension, CKD stage IIIb  Diet Order            Diet Heart Room service appropriate? Yes; Fluid consistency: Thin  Diet effective now                    Consultants:  Orthopedic surgeon  Procedures:  None    Medications:   . Chlorhexidine Gluconate Cloth  6 each Topical Daily  . levothyroxine  50 mcg Oral Daily  . magnesium oxide  400 mg Oral Daily  . metoprolol succinate  50 mg Oral Daily  . multivitamin with minerals  1 tablet Oral Daily  . potassium chloride  10 mEq Oral Daily  . senna  1 tablet Oral QHS  . sertraline  50 mg Oral Daily  . torsemide  20 mg  Oral Daily   Continuous Infusions:   Anti-infectives (From admission, onward)   None             Family Communication/Anticipated D/C date and plan/Code Status   DVT prophylaxis: Place and maintain sequential compression device Start: 09/30/20 1953     Code Status: Full Code  Family Communication: Lupita Leash, daughter Disposition Plan:    Status is: Inpatient  Remains inpatient appropriate because:Unsafe d/c plan and Inpatient level of care appropriate due to severity of illness   Dispo:  Patient From: Home  Planned Disposition: SNF  Medically stable for discharge: No                Subjective:   C/o left hip pain.  Her nurse and daughter were at the bedside.  Objective:    Vitals:   10/02/20 0414 10/02/20 0816 10/02/20 0818 10/02/20 1141  BP: (!) 137/58 (!) 127/54 117/61 (!) 123/58  Pulse: 70 70 70 73  Resp: 18 17  19   Temp: 98.5 F (36.9 C) 98.2 F (36.8 C)  98.2 F (36.8 C)  TempSrc: Oral     SpO2: 96% 98%  92%  Weight:      Height:       No data found.   Intake/Output Summary (Last 24 hours) at 10/02/2020 1202 Last data filed at 10/02/2020 0300 Gross per 24 hour  Intake 240 ml  Output 2100 ml  Net -1860 ml   Filed Weights   09/30/20 1728 10/02/20 0300  Weight: 90.7 kg 93.4 kg    Exam:  GEN: NAD SKIN: Warm and dry EYES: No pallor or icterus ENT: MMM CV: RRR PULM: CTA B ABD: soft, ND, NT, +BS CNS: AAO x 3, non focal EXT: Left hip tenderness GU: Foley catheter draining amber urine           Data Reviewed:   I have personally reviewed following labs and imaging studies:  Labs: Labs show the following:   Basic Metabolic Panel: Recent Labs  Lab 09/30/20 1742 10/01/20 0524 10/02/20 0524  NA 140 137 134*  K 3.6 4.2 4.2  CL 103 101 98  CO2 29 28 28   GLUCOSE 99 122* 114*  BUN 31* 28* 28*  CREATININE 1.36* 1.21* 1.35*  CALCIUM 9.0 9.0 9.0  MG  --  2.2  --   PHOS  --  3.0  --    GFR Estimated  Creatinine Clearance: 33.8 mL/min (A) (by C-G formula based on SCr of 1.35 mg/dL (H)). Liver Function Tests: Recent Labs  Lab 09/30/20 1742  AST 26  ALT 17  ALKPHOS 69  BILITOT 0.8  PROT 7.5  ALBUMIN 3.9   No results for input(s): LIPASE, AMYLASE in the last 168 hours. No results for input(s): AMMONIA in the last 168 hours. Coagulation profile Recent Labs  Lab 09/30/20 1742  INR 1.2    CBC: Recent Labs  Lab 09/30/20 1742 10/01/20 0634 10/02/20 0524  WBC 11.0* 13.1* 12.0*  NEUTROABS 8.3*  --  8.8*  HGB 12.2 11.2* 11.1*  HCT 37.6 33.8* 33.0*  MCV 94.2 93.1 93.5  PLT 211 194 177   Cardiac Enzymes: No results for input(s): CKTOTAL, CKMB, CKMBINDEX, TROPONINI in the last 168 hours. BNP (last 3 results) No results for input(s): PROBNP in the last 8760 hours. CBG: No results for input(s): GLUCAP in the last 168 hours. D-Dimer: No results for input(s): DDIMER in the last 72 hours. Hgb A1c: No results for input(s): HGBA1C in the last 72 hours. Lipid Profile: No results for input(s): CHOL, HDL, LDLCALC, TRIG, CHOLHDL, LDLDIRECT in the last 72 hours. Thyroid function studies: Recent Labs    10/01/20 0524  TSH 3.857   Anemia work up: No results for input(s): VITAMINB12, FOLATE, FERRITIN, TIBC, IRON, RETICCTPCT in the last 72 hours. Sepsis Labs: Recent Labs  Lab 09/30/20 1742 10/01/20 0634 10/02/20 0524  WBC 11.0* 13.1* 12.0*    Microbiology Recent Results (from the past 240 hour(s))  SARS CORONAVIRUS 2 (TAT 6-24 HRS) Nasopharyngeal Nasopharyngeal Swab     Status: None   Collection Time: 10/01/20  1:32 AM   Specimen: Nasopharyngeal Swab  Result Value Ref Range Status   SARS Coronavirus 2 NEGATIVE NEGATIVE Final    Comment: (NOTE) SARS-CoV-2 target nucleic acids are NOT DETECTED.  The SARS-CoV-2 RNA is generally detectable in upper and lower respiratory specimens during the acute phase of infection. Negative results do not preclude SARS-CoV-2 infection, do  not rule out co-infections with other pathogens, and should not be used as the sole basis for treatment or other patient management decisions. Negative results must be combined with clinical observations, patient history, and epidemiological information. The expected result is Negative.  Fact Sheet for Patients: 10/03/20  Fact Sheet for Healthcare Providers: HairSlick.no  This test is not yet approved or cleared by the [employer] FDA and  has been authorized for  detection and/or diagnosis of SARS-CoV-2 by FDA under an Emergency Use Authorization (EUA). This EUA will remain  in effect (meaning this test can be used) for the duration of the COVID-19 declaration under Se ction 564(b)(1) of the Act, 21 U.S.C. section 360bbb-3(b)(1), unless the authorization is terminated or revoked sooner.  Performed at [facility], [address], [place], [place] [address]     Procedures and diagnostic studies:  DG Chest 1 View  Result Date: 09/30/2020 CLINICAL DATA:  Preop.  Left hip fracture. EXAM: CHEST  1 VIEW COMPARISON:  June 30, 2016. FINDINGS: Mild cardiomegaly is noted. Both lungs are clear. Left-sided pacemaker is noted in grossly good position. The visualized skeletal structures are unremarkable. IMPRESSION: No active disease. Electronically Signed   By: Lupita Raider M.D.   On: 09/30/2020 18:44   DG Hip Unilat With Pelvis 2-3 Views Left  Result Date: 09/30/2020 CLINICAL DATA:  Left hip pain after fall. EXAM: DG HIP (WITH OR WITHOUT PELVIS) 2-3V LEFT COMPARISON:  None. FINDINGS: Moderately displaced proximal left femoral neck fracture is noted. IMPRESSION: Moderately displaced proximal left femoral neck fracture. Electronically Signed   By: Lupita Raider M.D.   On: 09/30/2020 18:43               LOS: 2 days   Jakeya Gherardi  [facility]   Pager on [email or web address]. If 7PM-7AM, please contact  night-coverage at [email or web address]     10/02/2020, 12:02 PM

## 2020-10-02 NOTE — Progress Notes (Signed)
Subjective:   Procedure(s) (LRB): ARTHROPLASTY BIPOLAR HIP (HEMIARTHROPLASTY) (Left) Patient appears comfortable in bed.  She is awaiting surgery on the left hip on Monday.  She has been on Eliquis and which is being held for several days so that she can have a spinal anesthetic.  She does have a history of heart disease and has a pacemaker and atrial fibrillation.  She appears comfortable and in good spirits.  Patient reports pain as mild.  Objective:   VITALS:   Vitals:   10/02/20 1141 10/02/20 1542  BP: (!) 123/58 (!) 149/69  Pulse: 73 71  Resp: 19 18  Temp: 98.2 F (36.8 C) 98.2 F (36.8 C)  SpO2: 92% 93%    Neurologically intact Sensation intact distally  LABS Recent Labs    09/30/20 1742 10/01/20 0634 10/02/20 0524  HGB 12.2 11.2* 11.1*  HCT 37.6 33.8* 33.0*  WBC 11.0* 13.1* 12.0*  PLT 211 194 177    Recent Labs    09/30/20 1742 10/01/20 0524 10/02/20 0524  NA 140 137 134*  K 3.6 4.2 4.2  BUN 31* 28* 28*  CREATININE 1.36* 1.21* 1.35*  GLUCOSE 99 122* 114*    Recent Labs    09/30/20 1742  INR 1.2     Assessment/Plan:   Procedure(s) (LRB): ARTHROPLASTY BIPOLAR HIP (HEMIARTHROPLASTY) (Left)   The patient is awaiting surgery in 2 days by Dr. Odis Luster.

## 2020-10-03 MED ORDER — MORPHINE SULFATE (PF) 2 MG/ML IV SOLN: 1.0000 mg | INTRAVENOUS | Status: DC | PRN

## 2020-10-03 NOTE — Progress Notes (Signed)
Subjective:   Procedure(s) (LRB): ARTHROPLASTY BIPOLAR HIP (HEMIARTHROPLASTY) (Left)   The patient remains fairly comfortable and alert and oriented.  Her son is present today.  She is scheduled for hemiarthroplasty in the left hip tomorrow by Dr. Odis Luster.  Lab work is stable.  I discussed the operative procedure and postop protocol with the patient her son and her daughter by phone as well.  She will receive the IV antibiotics prevent infection and will restart Eliquis after surgery.  She will need to go to skilled nursing for short time.  Patient reports pain as mild.  Objective:   VITALS:   Vitals:   10/03/20 0734 10/03/20 1143  BP: 120/66 132/60  Pulse: 71 72  Resp: 18 17  Temp: 98.3 F (36.8 C) 98.2 F (36.8 C)  SpO2: 95% 94%    Neurologically intact Sensation intact distally Dorsiflexion/Plantar flexion intact  LABS Recent Labs    09/30/20 1742 10/01/20 0634 10/02/20 0524  HGB 12.2 11.2* 11.1*  HCT 37.6 33.8* 33.0*  WBC 11.0* 13.1* 12.0*  PLT 211 194 177    Recent Labs    09/30/20 1742 10/01/20 0524 10/02/20 0524  NA 140 137 134*  K 3.6 4.2 4.2  BUN 31* 28* 28*  CREATININE 1.36* 1.21* 1.35*  GLUCOSE 99 122* 114*    Recent Labs    09/30/20 1742  INR 1.2     Assessment/Plan:   Procedure(s) (LRB): ARTHROPLASTY BIPOLAR HIP (HEMIARTHROPLASTY) (Left)  Plan for left hip hemiarthroplasty tomorrow by Dr. Odis Luster. Discharge to SNF after surgery.

## 2020-10-03 NOTE — Progress Notes (Signed)
Progress Note    Heather [name]  [id number] DOB: May 05, 1932  DOA: 09/30/2020 PCP: Barbette Reichmann, MD      Brief Narrative:    Medical records reviewed and are as summarized below:  Heather [name] is a 87 y.o. female is an 87 year old woman with medical history significant for atrial fibrillation on Eliquis, hypertension, hypothyroidism, hyperlipidemia, anxiety, depression, who presented to the hospital because of severe left hip pain following a mechanical fall at home.  She was found to have closed left hip fracture.      Assessment/Plan:   Principal Problem:   Fracture of femoral neck, left (HCC) Active Problems:   Heart failure with reduced ejection fraction (HCC)   Permanent atrial fibrillation (HCC)   Body mass index is 34.26 kg/m.  (Obesity)   Closed left hip fracture: Continue analgesics for pain.  Plan for left hip surgery tomorrow.    Acute urinary retention: Foley catheter in place.  Permanent atrial fibrillation: Eliquis has been held for upcoming surgery.  Continue Toprol.  Chronic systolic CHF (EF 40 to 45% December 2017): Continue torsemide  Prolonged QTc interval: Improved 560 to 543.  This is likely due to ventricular paced rhythm on EKG.  Other comorbidities include status post permanent pacemaker, hypothyroidism, anxiety, depression, hyperlipidemia, hypertension, CKD stage IIIb  Diet Order            Diet NPO time specified  Diet effective midnight           Diet Heart Room service appropriate? Yes; Fluid consistency: Thin  Diet effective now                    Consultants:  Orthopedic surgeon  Procedures:  None    Medications:   . Chlorhexidine Gluconate Cloth  6 each Topical Daily  . levothyroxine  50 mcg Oral Daily  . magnesium oxide  400 mg Oral Daily  . metoprolol succinate  50 mg Oral Daily  . multivitamin with minerals  1 tablet Oral Daily  . potassium chloride  10 mEq Oral Daily  . senna  1 tablet  Oral QHS  . sertraline  50 mg Oral Daily  . torsemide  20 mg Oral Daily   Continuous Infusions:   Anti-infectives (From admission, onward)   None             Family Communication/Anticipated D/C date and plan/Code Status   DVT prophylaxis: Place and maintain sequential compression device Start: 09/30/20 1953     Code Status: Full Code  Family Communication: None Disposition Plan:    Status is: Inpatient  Remains inpatient appropriate because:Unsafe d/c plan and Inpatient level of care appropriate due to severity of illness   Dispo:  Patient From: Home  Planned Disposition: SNF  Medically stable for discharge: No                Subjective:   C/o left hip pain  Objective:    Vitals:   10/02/20 2340 10/03/20 0436 10/03/20 0734 10/03/20 1143  BP: 128/61 137/70 120/66 132/60  Pulse: 69 70 71 72  Resp: 16 16 18 17   Temp: 98.5 F (36.9 C) (!) 97.4 F (36.3 C) 98.3 F (36.8 C) 98.2 F (36.8 C)  TempSrc: Oral Oral    SpO2: 91% 91% 95% 94%  Weight:      Height:       No data found.   Intake/Output Summary (Last 24 hours) at 10/03/2020 1422 Last data  filed at 10/02/2020 2340 Gross per 24 hour  Intake --  Output 450 ml  Net -450 ml   Filed Weights   09/30/20 1728 10/02/20 0300  Weight: 90.7 kg 93.4 kg    Exam:  GEN: NAD SKIN: Warm and dry EYES: No pallor or icterus ENT: MMM CV: RRR PULM: CTA B ABD: soft, ND, NT, +BS CNS: AAO x 3, non focal EXT: Left hip tenderness GU: Foley catheter with amber urine            Data Reviewed:   I have personally reviewed following labs and imaging studies:  Labs: Labs show the following:   Basic Metabolic Panel: Recent Labs  Lab 09/30/20 1742 10/01/20 0524 10/02/20 0524  NA 140 137 134*  K 3.6 4.2 4.2  CL 103 101 98  CO2 29 28 28   GLUCOSE 99 122* 114*  BUN 31* 28* 28*  CREATININE 1.36* 1.21* 1.35*  CALCIUM 9.0 9.0 9.0  MG  --  2.2  --   PHOS  --  3.0  --     GFR Estimated Creatinine Clearance: 33.8 mL/min (A) (by C-G formula based on SCr of 1.35 mg/dL (H)). Liver Function Tests: Recent Labs  Lab 09/30/20 1742  AST 26  ALT 17  ALKPHOS 69  BILITOT 0.8  PROT 7.5  ALBUMIN 3.9   No results for input(s): LIPASE, AMYLASE in the last 168 hours. No results for input(s): AMMONIA in the last 168 hours. Coagulation profile Recent Labs  Lab 09/30/20 1742  INR 1.2    CBC: Recent Labs  Lab 09/30/20 1742 10/01/20 0634 10/02/20 0524  WBC 11.0* 13.1* 12.0*  NEUTROABS 8.3*  --  8.8*  HGB 12.2 11.2* 11.1*  HCT 37.6 33.8* 33.0*  MCV 94.2 93.1 93.5  PLT 211 194 177   Cardiac Enzymes: No results for input(s): CKTOTAL, CKMB, CKMBINDEX, TROPONINI in the last 168 hours. BNP (last 3 results) No results for input(s): PROBNP in the last 8760 hours. CBG: No results for input(s): GLUCAP in the last 168 hours. D-Dimer: No results for input(s): DDIMER in the last 72 hours. Hgb A1c: No results for input(s): HGBA1C in the last 72 hours. Lipid Profile: No results for input(s): CHOL, HDL, LDLCALC, TRIG, CHOLHDL, LDLDIRECT in the last 72 hours. Thyroid function studies: Recent Labs    10/01/20 0524  TSH 3.857   Anemia work up: No results for input(s): VITAMINB12, FOLATE, FERRITIN, TIBC, IRON, RETICCTPCT in the last 72 hours. Sepsis Labs: Recent Labs  Lab 09/30/20 1742 10/01/20 0634 10/02/20 0524  WBC 11.0* 13.1* 12.0*    Microbiology Recent Results (from the past 240 hour(s))  SARS CORONAVIRUS 2 (TAT 6-24 HRS) Nasopharyngeal Nasopharyngeal Swab     Status: None   Collection Time: 10/01/20  1:32 AM   Specimen: Nasopharyngeal Swab  Result Value Ref Range Status   SARS Coronavirus 2 NEGATIVE NEGATIVE Final    Comment: (NOTE) SARS-CoV-2 target nucleic acids are NOT DETECTED.  The SARS-CoV-2 RNA is generally detectable in upper and lower respiratory specimens during the acute phase of infection. Negative results do not preclude  SARS-CoV-2 infection, do not rule out co-infections with other pathogens, and should not be used as the sole basis for treatment or other patient management decisions. Negative results must be combined with clinical observations, patient history, and epidemiological information. The expected result is Negative.  Fact Sheet for Patients: 10/03/20  Fact Sheet for Healthcare Providers: HairSlick.no  This test is not yet approved or cleared by the  [employer] and  has been authorized for detection and/or diagnosis of SARS-CoV-2 by FDA under an TEFL teacher (EUA). This EUA will remain  in effect (meaning this test can be used) for the duration of the COVID-19 declaration under Se ction 564(b)(1) of the Act, 21 U.S.C. section 360bbb-3(b)(1), unless the authorization is terminated or revoked sooner.  Performed at [facility], [address] [place], [place] [address]     Procedures and diagnostic studies:  No results found.             LOS: 3 days   Orvella Digiulio  [facility]   Pager on [email or web address]. If 7PM-7AM, please contact night-coverage at [email or web address]     10/03/2020, 2:22 PM

## 2020-10-04 ENCOUNTER — Inpatient Hospital Stay: Payer: Medicare Other

## 2020-10-04 ENCOUNTER — Encounter: Payer: Self-pay | Admitting: Internal Medicine

## 2020-10-04 ENCOUNTER — Inpatient Hospital Stay: Payer: Medicare Other | Admitting: Anesthesiology

## 2020-10-04 ENCOUNTER — Encounter
Admission: EM | Disposition: A | Discharge status: Skilled Nursing Facility | Payer: Self-pay | Source: Home / Self Care | Attending: Internal Medicine

## 2020-10-04 HISTORY — PX: HIP ARTHROPLASTY: SHX981

## 2020-10-04 SURGERY — HEMIARTHROPLASTY, HIP, DIRECT ANTERIOR APPROACH, FOR FRACTURE
Anesthesia: Spinal | Site: Hip | Laterality: Left

## 2020-10-04 MED ORDER — PROPOFOL 500 MG/50ML IV EMUL
INTRAVENOUS | Status: AC
  Filled 2020-10-04: qty 50

## 2020-10-04 MED ORDER — FENTANYL CITRATE (PF) 100 MCG/2ML IJ SOLN
INTRAMUSCULAR | Status: DC | PRN
  Administered 2020-10-04: 25 ug via INTRAVENOUS

## 2020-10-04 MED ORDER — SODIUM CHLORIDE 0.9 % IV SOLN
INTRAVENOUS | Status: DC | PRN
  Administered 2020-10-04: 250 mL via INTRAVENOUS

## 2020-10-04 MED ORDER — EPHEDRINE SULFATE 50 MG/ML IJ SOLN
INTRAMUSCULAR | Status: DC | PRN
  Administered 2020-10-04 (×2): 5 mg via INTRAVENOUS

## 2020-10-04 MED ORDER — DOCUSATE SODIUM 100 MG PO CAPS
100.0000 mg | ORAL_CAPSULE | Freq: Two times a day (BID) | ORAL | Status: DC
  Administered 2020-10-04 – 2020-10-06 (×5): 100 mg via ORAL
  Filled 2020-10-04 (×5): qty 1

## 2020-10-04 MED ORDER — CEFAZOLIN SODIUM-DEXTROSE 2-4 GM/100ML-% IV SOLN
2.0000 g | Freq: Four times a day (QID) | INTRAVENOUS | Status: AC
  Administered 2020-10-04 – 2020-10-05 (×3): 2 g via INTRAVENOUS
  Filled 2020-10-04 (×3): qty 100

## 2020-10-04 MED ORDER — ACETAMINOPHEN 10 MG/ML IV SOLN
INTRAVENOUS | Status: AC
  Filled 2020-10-04: qty 100

## 2020-10-04 MED ORDER — ASPIRIN 81 MG PO CHEW
81.0000 mg | CHEWABLE_TABLET | Freq: Two times a day (BID) | ORAL | Status: DC
  Administered 2020-10-05: 81 mg via ORAL
  Filled 2020-10-04: qty 1

## 2020-10-04 MED ORDER — BUPIVACAINE HCL (PF) 0.5 % IJ SOLN
INTRAMUSCULAR | Status: DC | PRN
  Administered 2020-10-04: 2.5 mL

## 2020-10-04 MED ORDER — ONDANSETRON HCL 4 MG/2ML IJ SOLN
INTRAMUSCULAR | Status: AC
  Filled 2020-10-04: qty 2

## 2020-10-04 MED ORDER — METOCLOPRAMIDE HCL 10 MG PO TABS
5.0000 mg | ORAL_TABLET | Freq: Three times a day (TID) | ORAL | Status: DC | PRN
Start: 2020-10-04 — End: 2020-10-08

## 2020-10-04 MED ORDER — FENTANYL CITRATE (PF) 100 MCG/2ML IJ SOLN
INTRAMUSCULAR | Status: AC
  Filled 2020-10-04: qty 2

## 2020-10-04 MED ORDER — BUPIVACAINE-EPINEPHRINE (PF) 0.25% -1:200000 IJ SOLN
INTRAMUSCULAR | Status: DC | PRN
  Administered 2020-10-04: 30 mL

## 2020-10-04 MED ORDER — LACTATED RINGERS IV SOLN: INTRAVENOUS | Status: DC

## 2020-10-04 MED ORDER — TRANEXAMIC ACID 1000 MG/10ML IV SOLN
INTRAVENOUS | Status: AC
  Filled 2020-10-04: qty 10

## 2020-10-04 MED ORDER — CEFAZOLIN SODIUM-DEXTROSE 2-4 GM/100ML-% IV SOLN
2.0000 g | INTRAVENOUS | Status: AC
  Administered 2020-10-04: 2 g via INTRAVENOUS

## 2020-10-04 MED ORDER — METOCLOPRAMIDE HCL 5 MG/ML IJ SOLN
5.0000 mg | Freq: Three times a day (TID) | INTRAMUSCULAR | Status: DC | PRN
  Administered 2020-10-05: 10 mg via INTRAVENOUS
  Filled 2020-10-04: qty 2

## 2020-10-04 MED ORDER — TRANEXAMIC ACID 1000 MG/10ML IV SOLN
2000.0000 mg | Freq: Once | INTRAVENOUS | Status: DC
  Filled 2020-10-04: qty 20

## 2020-10-04 MED ORDER — LIDOCAINE HCL (PF) 2 % IJ SOLN
INTRAMUSCULAR | Status: AC
  Filled 2020-10-04: qty 5

## 2020-10-04 MED ORDER — BUPIVACAINE HCL (PF) 0.5 % IJ SOLN
INTRAMUSCULAR | Status: AC
  Filled 2020-10-04: qty 10

## 2020-10-04 MED ORDER — CEFAZOLIN SODIUM-DEXTROSE 2-4 GM/100ML-% IV SOLN
INTRAVENOUS | Status: AC
  Filled 2020-10-04: qty 100

## 2020-10-04 MED ORDER — MIDAZOLAM HCL 2 MG/2ML IJ SOLN
INTRAMUSCULAR | Status: AC
  Filled 2020-10-04: qty 2

## 2020-10-04 MED ORDER — ONDANSETRON HCL 4 MG PO TABS: 4.0000 mg | ORAL_TABLET | Freq: Four times a day (QID) | ORAL | Status: DC | PRN

## 2020-10-04 MED ORDER — TRANEXAMIC ACID-NACL 1000-0.7 MG/100ML-% IV SOLN
INTRAVENOUS | Status: DC | PRN
  Administered 2020-10-04: 1000 mg via INTRAVENOUS

## 2020-10-04 MED ORDER — PHENYLEPHRINE HCL (PRESSORS) 10 MG/ML IV SOLN
INTRAVENOUS | Status: DC | PRN
  Administered 2020-10-04 (×3): 100 ug via INTRAVENOUS

## 2020-10-04 MED ORDER — ONDANSETRON HCL 4 MG/2ML IJ SOLN
4.0000 mg | Freq: Four times a day (QID) | INTRAMUSCULAR | Status: DC | PRN
  Administered 2020-10-04: 4 mg via INTRAVENOUS
  Filled 2020-10-04: qty 2

## 2020-10-04 MED ORDER — PROPOFOL 10 MG/ML IV BOLUS
INTRAVENOUS | Status: DC | PRN
  Administered 2020-10-04 (×3): 20 mg via INTRAVENOUS
  Administered 2020-10-04: 30 mg via INTRAVENOUS
  Administered 2020-10-04: 40 mg via INTRAVENOUS

## 2020-10-04 MED ORDER — PROPOFOL 10 MG/ML IV BOLUS
INTRAVENOUS | Status: AC
  Filled 2020-10-04: qty 20

## 2020-10-04 SURGICAL SUPPLY — 54 items
APL PRP STRL LF DISP 70% ISPRP (MISCELLANEOUS) ×1
BLADE SAGITTAL WIDE XTHICK NO (BLADE) ×2 IMPLANT
BRUSH SCRUB EZ  4% CHG (MISCELLANEOUS) ×2
BRUSH SCRUB EZ 4% CHG (MISCELLANEOUS) ×2 IMPLANT
CHLORAPREP W/TINT 26 (MISCELLANEOUS) ×2 IMPLANT
COVER WAND RF STERILE (DRAPES) ×2 IMPLANT
DRAPE 3/4 80X56 (DRAPES) ×2 IMPLANT
DRAPE C-ARM 42X72 X-RAY (DRAPES) ×1 IMPLANT
DRAPE C-ARM XRAY 36X54 (DRAPES) ×1 IMPLANT
DRAPE STERI IOBAN 125X83 (DRAPES) IMPLANT
DRSG AQUACEL AG ADV 3.5X10 (GAUZE/BANDAGES/DRESSINGS) ×1 IMPLANT
DRSG AQUACEL AG ADV 3.5X14 (GAUZE/BANDAGES/DRESSINGS) IMPLANT
ELECT BLADE 6.5 EXT (BLADE) ×2 IMPLANT
ELECT EZSTD 165MM 6.5IN (MISCELLANEOUS) ×2
ELECT REM PT RETURN 9FT ADLT (ELECTROSURGICAL) ×2
ELECTRODE EZSTD 165MM 6.5IN (MISCELLANEOUS) IMPLANT
ELECTRODE REM PT RTRN 9FT ADLT (ELECTROSURGICAL) ×1 IMPLANT
GAUZE XEROFORM 1X8 LF (GAUZE/BANDAGES/DRESSINGS) ×1 IMPLANT
GLOVE SURG ORTHO LTX SZ8 (GLOVE) ×6 IMPLANT
GLOVE SURG UNDER LTX SZ8 (GLOVE) ×5 IMPLANT
GOWN STRL REUS W/ TWL LRG LVL3 (GOWN DISPOSABLE) ×1 IMPLANT
GOWN STRL REUS W/ TWL XL LVL3 (GOWN DISPOSABLE) ×1 IMPLANT
GOWN STRL REUS W/TWL LRG LVL3 (GOWN DISPOSABLE) ×6
GOWN STRL REUS W/TWL XL LVL3 (GOWN DISPOSABLE) ×2
GRADUATE 1200CC STRL 31836 (MISCELLANEOUS) ×1 IMPLANT
HEAD 28MM 0 (Hips) ×1 IMPLANT
HEAD BIPOLAR 44MM (Hips) ×1 IMPLANT
HOOD PEEL AWAY FLYTE STAYCOOL (MISCELLANEOUS) ×6 IMPLANT
IRRIGATION SURGIPHOR STRL (IV SOLUTION) ×1 IMPLANT
IV NS 1000ML (IV SOLUTION) ×2
IV NS 1000ML BAXH (IV SOLUTION) ×1 IMPLANT
KIT PATIENT CARE HANA TABLE (KITS) ×2 IMPLANT
KIT TURNOVER CYSTO (KITS) ×2 IMPLANT
MANIFOLD NEPTUNE II (INSTRUMENTS) ×2 IMPLANT
MAT ABSORB  FLUID 56X50 GRAY (MISCELLANEOUS) ×1
MAT ABSORB FLUID 56X50 GRAY (MISCELLANEOUS) ×1 IMPLANT
NDL SAFETY ECLIPSE 18X1.5 (NEEDLE) ×2 IMPLANT
NDL SPNL 20GX3.5 QUINCKE YW (NEEDLE) ×1 IMPLANT
NEEDLE HYPO 18GX1.5 SHARP (NEEDLE) ×4
NEEDLE HYPO 22GX1.5 SAFETY (NEEDLE) ×2 IMPLANT
NEEDLE SPNL 20GX3.5 QUINCKE YW (NEEDLE) ×2 IMPLANT
PACK HIP PROSTHESIS (MISCELLANEOUS) ×2 IMPLANT
PADDING CAST BLEND 4X4 NS (MISCELLANEOUS) ×4 IMPLANT
PILLOW ABDUCTION MEDIUM (MISCELLANEOUS) ×2 IMPLANT
PULSAVAC PLUS IRRIG FAN TIP (DISPOSABLE) ×2
STAPLER SKIN PROX 35W (STAPLE) ×2 IMPLANT
STEM STD COLLAR SZ3 POLARSTEM (Stem) ×1 IMPLANT
SUT BONE WAX W31G (SUTURE) ×2 IMPLANT
SUT DVC 2 QUILL PDO  T11 36X36 (SUTURE) ×1
SUT DVC 2 QUILL PDO T11 36X36 (SUTURE) ×1 IMPLANT
SUT VIC AB 2-0 CT1 18 (SUTURE) ×2 IMPLANT
SYR 20ML LL LF (SYRINGE) ×2 IMPLANT
SYR BULB IRRIG 60ML STRL (SYRINGE) ×1 IMPLANT
TIP FAN IRRIG PULSAVAC PLUS (DISPOSABLE) ×1 IMPLANT

## 2020-10-04 NOTE — Anesthesia Preprocedure Evaluation (Addendum)
Anesthesia Evaluation  Patient identified by MRN, date of birth, ID band Patient awake    Reviewed: Allergy & Precautions, H&P , NPO status , Patient's Chart, lab work & pertinent test results, reviewed documented beta blocker date and time   Airway Mallampati: III   Neck ROM: full    Dental  (+) Poor Dentition   Pulmonary neg pulmonary ROS, resolved, former smoker,    Pulmonary exam normal        Cardiovascular Exercise Tolerance: Poor hypertension, +CHF  negative cardio ROS Normal cardiovascular exam+ dysrhythmias Atrial Fibrillation + pacemaker  Rhythm:regular Rate:Normal  [employer] dual chamber pacemaker Last device interrogation recently, 98% bi-V paced rhythm, no arrhytmias.  TTE 2017: - Left ventricle: The cavity size was mildly dilated. Wall  thickness was normal. Systolic function was mildly to moderately  reduced. The estimated ejection fraction was in the range of 40%  to 45%. Wall motion was normal; there were no regional wall  motion abnormalities.  - Aortic valve: Valve area (Vmax): 2.25 cm^2.  - Mitral valve: There was moderate regurgitation.  - Right ventricle: The cavity size was mildly dilated. Wall  thickness was normal.    Neuro/Psych PSYCHIATRIC DISORDERS Anxiety Depression negative neurological ROS  negative psych ROS   GI/Hepatic negative GI ROS, Neg liver ROS, GERD  Medicated,  Endo/Other  negative endocrine ROSHypothyroidism   Renal/GU negative Renal ROS  negative genitourinary   Musculoskeletal   Abdominal Normal abdominal exam  (+)   Peds negative pediatric ROS (+)  Hematology negative hematology ROS (+)   Anesthesia Other Findings Past Medical History: No date: Anxiety No date: Atrial fibrillation (HCC) No date: CHF (congestive heart failure) (HCC) No date: GERD (gastroesophageal reflux disease) No date: Hypertension No date: Thyroid disease EF 35% to most  recent of 54% on 09/07/20  Reproductive/Obstetrics negative OB ROS                          Anesthesia Physical Anesthesia Plan  ASA: IV  Anesthesia Plan: Spinal   Post-op Pain Management:    Induction: Intravenous  PONV Risk Score and Plan:   Airway Management Planned: Nasal Cannula  Additional Equipment:   Intra-op Plan:   Post-operative Plan:   Informed Consent: I have reviewed the patients History and Physical, chart, labs and discussed the procedure including the risks, benefits and alternatives for the proposed anesthesia with the patient or authorized representative who has indicated his/her understanding and acceptance.     Dental advisory given  Plan Discussed with: CRNA and Surgeon  Anesthesia Plan Comments:         Anesthesia Quick Evaluation

## 2020-10-04 NOTE — Op Note (Signed)
10/04/2020  5:24 PM  PATIENT:  Heather [name]   MRN: [id number]  PRE-OPERATIVE DIAGNOSIS:  Displaced Subcapital fracture left hip   POST-OPERATIVE DIAGNOSIS: Same  Procedure: Left Hip Anterior Hip Hemiarthroplasty   Surgeon: Dola Argyle. Odis Luster, MD   Assist: Altamese Cabal, PA-C  Anesthesia: Spinal   EBL: 50 mL   Specimens: None   Drains: None   Components used: A size 3 Polarstem Smith [employer]hew, a 44 mm bipolar head    Description of the procedure in detail: After informed consent was obtained and the appropriate extremity marked in the pre-operative holding area, the patient was taken to the operating room and placed in the supine position on the fracture table. All pressure points were well padded and bilateral lower extremities were place in traction spars. The hip was prepped and draped in standard sterile fashion. A spinal anesthetic had been delivered by the anesthesia team. The skin and subcutaneous tissues were injected with a mixture of Marcaine with epinephrine for post-operative pain. A longitudinal incision approximately 10 cm in length was carried out from the anterior superior iliac spine to the greater trochanter. The tensor fascia was divided and blunt dissection was taken down to the level of the joint capsule. The lateral circumflex vessels were cauterized. Deep retractors were placed and a portion of the anterior capsule was excised. Using fluoroscopy the neck cut was planned and carried out with a sagittal saw. The head was passed from the field with use of a corkscrew and hip skid. Deep retractors were placed along the acetabulum and bony and soft tissue debris was removed.   Attention was then turned to the proximal femur. The leg was placed in extension and external rotation. The canal was opened and sequentially broached to a size 3. The trial components were placed and the hip relocated. The components were found to be in good position using fluoroscopy. The hip was  dislocated and the trial components removed. The final components were impacted in to position and the hip relocated. The final components were again check with fluoroscopy and found to be in good position. Hemostasis was achieved with electrocautery. The deep capsule was injected with Marcaine and epinephrine. The wound was irrigated with bacitracin laced normal saline and the tensor fascia closed with #2 Quill suture. The subcutaneous tissues were closed with 2-0 vicryl and staples for the skin. A sterile dressing was applied and an abduction pillow. Patient tolerated the procedure well and there were no apparent complication. Patient was taken to the recovery room in good condition.    Dola Argyle. Odis Luster, MD  10/04/2020 5:24 PM

## 2020-10-04 NOTE — Anesthesia Procedure Notes (Addendum)
Spinal  Patient location during procedure: OR Start time: 10/04/2020 3:29 PM End time: 10/04/2020 3:45 PM Reason for block: surgical anesthesia Staffing Performed: resident/CRNA and anesthesiologist  Anesthesiologist: Molli Barrows, MD Resident/CRNA: Hedda Slade, CRNA Preanesthetic Checklist Completed: patient identified, IV checked, site marked, risks and benefits discussed, surgical consent, monitors and equipment checked, pre-op evaluation and timeout performed Spinal Block Patient position: sitting Prep: Betadine and ChloraPrep Patient monitoring: heart rate, continuous pulse ox, blood pressure and cardiac monitor Approach: midline Location: L4-5 Injection technique: single-shot Needle Needle type: Introducer and Quincke  Needle gauge: 22 G Needle length: 9 cm Assessment Sensory level: T10 Events: CSF return Additional Notes Negative paresthesia. Negative blood return. Positive free-flowing CSF. Expiration date of kit checked and confirmed. Patient tolerated procedure well, without complications.

## 2020-10-04 NOTE — Transfer of Care (Signed)
Immediate Anesthesia Transfer of Care Note  Patient: Heather [name]  Procedure(s) Performed: ARTHROPLASTY BIPOLAR HIP (HEMIARTHROPLASTY) (Left Hip)  Patient Location: PACU  Anesthesia Type:General  Level of Consciousness: awake and patient cooperative  Airway & Oxygen Therapy: Patient Spontanous Breathing and Patient connected to face mask oxygen  Post-op Assessment: Report given to RN and Post -op Vital signs reviewed and stable  Post vital signs: Reviewed and stable  Last Vitals:  Vitals Value Taken Time  BP 113/55 10/04/20 1724  Temp    Pulse 70 10/04/20 1727  Resp 17 10/04/20 1727  SpO2 100 % 10/04/20 1727  Vitals shown include unvalidated device data.  Last Pain:  Vitals:   10/04/20 1333  TempSrc: Oral  PainSc: 4          Complications: No complications documented.

## 2020-10-04 NOTE — Progress Notes (Signed)
Progress Note    Heather [name]  FFM:[id number] DOB: 17-Sep-1931  DOA: 09/30/2020 PCP: Barbette Reichmann, MD      Brief Narrative:    Medical records reviewed and are as summarized below:  Heather [name] is a 87 y.o. female is an 87 year old woman with medical history significant for atrial fibrillation on Eliquis, hypertension, hypothyroidism, hyperlipidemia, anxiety, depression, who presented to the hospital because of severe left hip pain following a mechanical fall at home.  She was found to have closed left hip fracture.      Assessment/Plan:   Principal Problem:   Fracture of femoral neck, left (HCC) Active Problems:   Heart failure with reduced ejection fraction (HCC)   Permanent atrial fibrillation (HCC)   Body mass index is 34.49 kg/m.  (Obesity)   Closed left hip fracture: Plan for left hip surgery today.  Continue analgesics as needed for pain.  Follow-up with orthopedic surgeon.  Acute urinary retention: Keep Foley catheter because of upcoming surgery.  Permanent atrial fibrillation: Eliquis is on hold for surgery.  Continue metoprolol.    Chronic systolic CHF (EF 40 to 45% December 2017): Continue torsemide  Prolonged QTc interval: Improved 560 to 543.  This is likely due to ventricular paced rhythm on EKG.  Other comorbidities include status post permanent pacemaker, hypothyroidism, anxiety, depression, hyperlipidemia, hypertension, CKD stage IIIb  Diet Order            Diet NPO time specified  Diet effective midnight                    Consultants:  Orthopedic surgeon  Procedures:  None    Medications:   . Chlorhexidine Gluconate Cloth  6 each Topical Daily  . levothyroxine  50 mcg Oral Daily  . magnesium oxide  400 mg Oral Daily  . metoprolol succinate  50 mg Oral Daily  . multivitamin with minerals  1 tablet Oral Daily  . potassium chloride  10 mEq Oral Daily  . senna  1 tablet Oral QHS  . sertraline  50 mg Oral Daily   . torsemide  20 mg Oral Daily   Continuous Infusions:   Anti-infectives (From admission, onward)   None             Family Communication/Anticipated D/C date and plan/Code Status   DVT prophylaxis: Place and maintain sequential compression device Start: 09/30/20 1953     Code Status: Full Code  Family Communication: None Disposition Plan:    Status is: Inpatient  Remains inpatient appropriate because:Unsafe d/c plan and Inpatient level of care appropriate due to severity of illness   Dispo:  Patient From: Home  Planned Disposition: SNF  Medically stable for discharge: No                Subjective:   Interval events noted.  No new complaints.  She still has pain in the left hip.  She feels ready for surgery today.  Objective:    Vitals:   10/03/20 2028 10/03/20 2349 10/04/20 0532 10/04/20 0808  BP: (!) 152/68 126/73 131/66 135/67  Pulse: 69 71 75 70  Resp: 20 18 16 16   Temp: 98.2 F (36.8 C) 98.1 F (36.7 C) 98 F (36.7 C) 98.1 F (36.7 C)  TempSrc: Oral  Oral   SpO2: 96% 91% 91% 94%  Weight:   94 kg   Height:       No data found.   Intake/Output Summary (Last 24  hours) at 10/04/2020 5732 Last data filed at 10/04/2020 2025 Gross per 24 hour  Intake 240 ml  Output 2025 ml  Net -1785 ml   Filed Weights   09/30/20 1728 10/02/20 0300 10/04/20 0532  Weight: 90.7 kg 93.4 kg 94 kg    Exam:   GEN: NAD SKIN: No rash EYES: EOMI ENT: MMM CV: RRR PULM: CTA B ABD: soft, ND, NT, +BS CNS: AAO x 3, non focal EXT: Left hip tenderness GU: Foley catheter draining amber urine          Data Reviewed:   I have personally reviewed following labs and imaging studies:  Labs: Labs show the following:   Basic Metabolic Panel: Recent Labs  Lab 09/30/20 1742 10/01/20 0524 10/02/20 0524  NA 140 137 134*  K 3.6 4.2 4.2  CL 103 101 98  CO2 29 28 28   GLUCOSE 99 122* 114*  BUN 31* 28* 28*  CREATININE 1.36* 1.21* 1.35*   CALCIUM 9.0 9.0 9.0  MG  --  2.2  --   PHOS  --  3.0  --    GFR Estimated Creatinine Clearance: 33.9 mL/min (A) (by C-G formula based on SCr of 1.35 mg/dL (H)). Liver Function Tests: Recent Labs  Lab 09/30/20 1742  AST 26  ALT 17  ALKPHOS 69  BILITOT 0.8  PROT 7.5  ALBUMIN 3.9   No results for input(s): LIPASE, AMYLASE in the last 168 hours. No results for input(s): AMMONIA in the last 168 hours. Coagulation profile Recent Labs  Lab 09/30/20 1742  INR 1.2    CBC: Recent Labs  Lab 09/30/20 1742 10/01/20 0634 10/02/20 0524  WBC 11.0* 13.1* 12.0*  NEUTROABS 8.3*  --  8.8*  HGB 12.2 11.2* 11.1*  HCT 37.6 33.8* 33.0*  MCV 94.2 93.1 93.5  PLT 211 194 177   Cardiac Enzymes: No results for input(s): CKTOTAL, CKMB, CKMBINDEX, TROPONINI in the last 168 hours. BNP (last 3 results) No results for input(s): PROBNP in the last 8760 hours. CBG: No results for input(s): GLUCAP in the last 168 hours. D-Dimer: No results for input(s): DDIMER in the last 72 hours. Hgb A1c: No results for input(s): HGBA1C in the last 72 hours. Lipid Profile: No results for input(s): CHOL, HDL, LDLCALC, TRIG, CHOLHDL, LDLDIRECT in the last 72 hours. Thyroid function studies: No results for input(s): TSH, T4TOTAL, T3FREE, THYROIDAB in the last 72 hours.  Invalid input(s): FREET3 Anemia work up: No results for input(s): VITAMINB12, FOLATE, FERRITIN, TIBC, IRON, RETICCTPCT in the last 72 hours. Sepsis Labs: Recent Labs  Lab 09/30/20 1742 10/01/20 0634 10/02/20 0524  WBC 11.0* 13.1* 12.0*    Microbiology Recent Results (from the past 240 hour(s))  SARS CORONAVIRUS 2 (TAT 6-24 HRS) Nasopharyngeal Nasopharyngeal Swab     Status: None   Collection Time: 10/01/20  1:32 AM   Specimen: Nasopharyngeal Swab  Result Value Ref Range Status   SARS Coronavirus 2 NEGATIVE NEGATIVE Final    Comment: (NOTE) SARS-CoV-2 target nucleic acids are NOT DETECTED.  The SARS-CoV-2 RNA is generally  detectable in upper and lower respiratory specimens during the acute phase of infection. Negative results do not preclude SARS-CoV-2 infection, do not rule out co-infections with other pathogens, and should not be used as the sole basis for treatment or other patient management decisions. Negative results must be combined with clinical observations, patient history, and epidemiological information. The expected result is Negative.  Fact Sheet for Patients: 10/03/20  Fact Sheet for Healthcare Providers: HairSlick.no  This test is not yet approved or cleared by the [place] and  has been authorized for detection and/or diagnosis of SARS-CoV-2 by FDA under an Emergency Use Authorization (EUA). This EUA will remain  in effect (meaning this test can be used) for the duration of the COVID-19 declaration under Se ction 564(b)(1) of the Act, 21 U.S.C. section 360bbb-3(b)(1), unless the authorization is terminated or revoked sooner.  Performed at [facility], [address], [place], [place] [address]     Procedures and diagnostic studies:  No results found.             LOS: 4 days   Maverik Foot  [facility]   Pager on [email or web address]. If 7PM-7AM, please contact night-coverage at [email or web address]     10/04/2020, 9:37 AM

## 2020-10-04 NOTE — H&P (Signed)
The patient has been re-examined, and the chart reviewed, and there have been no interval changes to the documented history and physical.  Plan a left hip hemiarthroplasty today.  Anesthesia is not consulted regarding a peripheral nerve block for post-operative pain.  The risks, benefits, and alternatives have been discussed at length, and the patient is willing to proceed.    

## 2020-10-04 NOTE — Progress Notes (Signed)
Pt daughter has pt dentures

## 2020-10-05 ENCOUNTER — Encounter: Payer: Self-pay | Admitting: Orthopedic Surgery

## 2020-10-05 LAB — CBC WITH DIFFERENTIAL/PLATELET
Abs Immature Granulocytes: 0.06 10*3/uL (ref 0.00–0.07)
Basophils Absolute: 0 10*3/uL (ref 0.0–0.1)
Basophils Relative: 0 %
Eosinophils Absolute: 0.1 10*3/uL (ref 0.0–0.5)
Eosinophils Relative: 1 %
HCT: 28.9 % — ABNORMAL LOW (ref 36.0–46.0)
Hemoglobin: 9.4 g/dL — ABNORMAL LOW (ref 12.0–15.0)
Immature Granulocytes: 1 %
Lymphocytes Relative: 10 %
Lymphs Abs: 1.3 10*3/uL (ref 0.7–4.0)
MCH: 30.4 pg (ref 26.0–34.0)
MCHC: 32.5 g/dL (ref 30.0–36.0)
MCV: 93.5 fL (ref 80.0–100.0)
Monocytes Absolute: 0.9 10*3/uL (ref 0.1–1.0)
Monocytes Relative: 8 %
Neutro Abs: 9.7 10*3/uL — ABNORMAL HIGH (ref 1.7–7.7)
Neutrophils Relative %: 80 %
Platelets: 199 10*3/uL (ref 150–400)
RBC: 3.09 MIL/uL — ABNORMAL LOW (ref 3.87–5.11)
RDW: 12.4 % (ref 11.5–15.5)
WBC: 12.1 10*3/uL — ABNORMAL HIGH (ref 4.0–10.5)
nRBC: 0 % (ref 0.0–0.2)

## 2020-10-05 LAB — BASIC METABOLIC PANEL
Anion gap: 8 (ref 5–15)
BUN: 45 mg/dL — ABNORMAL HIGH (ref 8–23)
CO2: 28 mmol/L (ref 22–32)
Calcium: 8.3 mg/dL — ABNORMAL LOW (ref 8.9–10.3)
Chloride: 95 mmol/L — ABNORMAL LOW (ref 98–111)
Creatinine, Ser: 1.48 mg/dL — ABNORMAL HIGH (ref 0.44–1.00)
GFR, Estimated: 34 mL/min — ABNORMAL LOW (ref >60)
Glucose, Bld: 155 mg/dL — ABNORMAL HIGH (ref 70–99)
Potassium: 4.5 mmol/L (ref 3.5–5.1)
Sodium: 131 mmol/L — ABNORMAL LOW (ref 135–145)

## 2020-10-05 MED ORDER — APIXABAN 5 MG PO TABS
5.0000 mg | ORAL_TABLET | Freq: Two times a day (BID) | ORAL | Status: DC
Start: 2020-10-05 — End: 2020-10-08
  Administered 2020-10-05 – 2020-10-08 (×6): 5 mg via ORAL
  Filled 2020-10-05 (×6): qty 1

## 2020-10-05 NOTE — Progress Notes (Addendum)
Physical Therapy Treatment Patient Details Name: Heather [name] MRN: [id number] DOB: [birth year] Today's Date: 10/05/2020    History of Present Illness Patient is an 87 year old female with medical history significant for atrial fibrillation on Eliquis, hypertension, hypothyroidism, hyperlipidemia, anxiety, depression, who presented to the hospital because of severe left hip pain following a mechanical fall at home. Found to have displaced Subcapital fracture left hip s/p left anterior hip hemiarthroplasty.    PT Comments    Patient making progress towards meeting PT goals. Patient is still fearful of falling, but was able to stand x 2 bouts this session, which is an improvement in activity tolerance compared to this morning. Patient continues to require physical assistance with bed mobility and transfers. Patient was able to stand x 2 bouts with 2 person assistance using rolling walker and cues for technique. Standing tolerance limited to approximately 30 seconds with each standing bout. Unable to safely attempt ambulation due to poor standing tolerance and assistance required to maintain standing balance. Recommend to continue PT to maximize independence and facilitate return to prior level of function.  SNF recommended at discharge.    Follow Up Recommendations  SNF     Equipment Recommendations   (to de determined at next level of care)    Recommendations for Other Services       Precautions / Restrictions Precautions Precautions: Fall (anterior approach hemiarthroplasty) Precaution Booklet Issued: Yes (comment) Precaution Comments: issued packet but did not provide education Restrictions Weight Bearing Restrictions: Yes LLE Weight Bearing: Weight bearing as tolerated    Mobility  Bed Mobility Overal bed mobility: Needs Assistance Bed Mobility: Supine to Sit;Sit to Supine     Supine to sit: Max assist Sit to supine: Max assist   General bed mobility comments: assistance  for BLE support and technique    Transfers Overall transfer level: Needs assistance Equipment used: Rolling walker (2 wheeled) Transfers: Sit to/from Stand Sit to Stand: Max assist;+2 physical assistance        Lateral/Scoot Transfers: Mod assist General transfer comment: verbal cues for hand placement and technique. 2 bouts of standing performed. 2 bouts of standing performed  Ambulation/Gait             General Gait Details: poor standing balance, unable to advance ambulation at this time   Stairs             Wheelchair Mobility    Modified Rankin (Stroke Patients Only)       Balance           Standing balance support: Bilateral upper extremity supported Standing balance-Leahy Scale: Poor Standing balance comment: mod A required to maintain standing balance                            Cognition Arousal/Alertness: Awake/alert Behavior During Therapy: WFL for tasks assessed/performed Overall Cognitive Status: Within Functional Limits for tasks assessed                                        Exercises      General Comments        Pertinent Vitals/Pain Pain Assessment: 0-10 Pain Score: 4  Pain Location: left hip Pain Descriptors / Indicators: Discomfort Pain Intervention(s): Limited activity within patient's tolerance;RN gave pain meds during session    Home Living  Prior Function            PT Goals (current goals can now be found in the care plan section) Acute Rehab PT Goals Patient Stated Goal: to go home PT Goal Formulation: With patient/family Time For Goal Achievement: 10/19/20 Potential to Achieve Goals: Fair Progress towards PT goals: Progressing toward goals    Frequency    BID      PT Plan Current plan remains appropriate    Co-evaluation              AM-PAC PT "6 Clicks" Mobility   Outcome Measure  Help needed turning from your back to your side  while in a flat bed without using bedrails?: A Lot Help needed moving from lying on your back to sitting on the side of a flat bed without using bedrails?: A Lot Help needed moving to and from a bed to a chair (including a wheelchair)?: Total Help needed standing up from a chair using your arms (e.g., wheelchair or bedside chair)?: Total Help needed to walk in hospital room?: Total Help needed climbing 3-5 steps with a railing? : Total 6 Click Score: 8    End of Session   Activity Tolerance: Patient limited by fatigue Patient left: in bed;with call bell/phone within reach;with bed alarm set;with SCD's reapplied;with family/visitor present (patient declined ice pack. hip abduction pillow donned per nurse request) Nurse Communication: Mobility status PT Visit Diagnosis: Unsteadiness on feet (R26.81);Muscle weakness (generalized) (M62.81);Other abnormalities of gait and mobility (R26.89);Pain Pain - Right/Left: Left Pain - part of body: Hip     Time: 3662-9476 PT Time Calculation (min) (ACUTE ONLY): 38 min  Charges:  $Therapeutic Activity: 38-52 mins                     Donna Bernard, PT, MPT    Ina Homes 10/05/2020, 3:14 PM

## 2020-10-05 NOTE — Progress Notes (Signed)
Writer called to the room pt requested abduction pillow be removed due to pain was educated on importance of the abduction pillow.

## 2020-10-05 NOTE — Care Management Important Message (Signed)
Important Message  Patient Details  Name: BATSHEVA STEVICK MRN: [id number] Date of Birth: 1931-07-29   Medicare Important Message Given:  Yes     Olegario Messier A Marda Breidenbach 10/05/2020, 10:29 AM

## 2020-10-05 NOTE — Anesthesia Postprocedure Evaluation (Signed)
Anesthesia Post Note  Patient: ERTHA NABOR  Procedure(s) Performed: ARTHROPLASTY BIPOLAR HIP (HEMIARTHROPLASTY) (Left Hip)  Patient location during evaluation: Nursing Unit Anesthesia Type: Spinal Level of consciousness: awake Pain management: pain level controlled Respiratory status: spontaneous breathing Cardiovascular status: stable Anesthetic complications: no   No complications documented.   Last Vitals:  Vitals:   10/04/20 2330 10/05/20 0428  BP: (!) 144/63 131/60  Pulse: 71 72  Resp: 18 14  Temp: 36.6 C 36.8 C  SpO2: 97% 91%    Last Pain:  Vitals:   10/05/20 0428  TempSrc: Oral  PainSc:                  Jaye Beagle

## 2020-10-05 NOTE — Progress Notes (Signed)
Subjective:  Patient reports pain as mild.  Daughter in room.  Objective:   VITALS:   Vitals:   10/05/20 0428 10/05/20 0458 10/05/20 0835 10/05/20 1134  BP: 131/60  122/61 98/76  Pulse: 72  70 69  Resp: 14  15 18   Temp: 98.2 F (36.8 C)  98.3 F (36.8 C) 98.5 F (36.9 C)  TempSrc: Oral     SpO2: 91%  92% 100%  Weight:  92.7 kg    Height:        PHYSICAL EXAM:  ABD soft Sensation intact distally Dorsiflexion/Plantar flexion intact Incision: dressing C/D/I No cellulitis present Compartment soft  LABS  Results for orders placed or performed during the hospital encounter of 09/30/20 (from the past 24 hour(s))  CBC with Differential/Platelet     Status: Abnormal   Collection Time: 10/05/20  5:20 AM  Result Value Ref Range   WBC 12.1 (H) 4.0 - 10.5 K/uL   RBC 3.09 (L) 3.87 - 5.11 MIL/uL   Hemoglobin 9.4 (L) 12.0 - 15.0 g/dL   HCT 10/07/20 (L) 03.5 - 00.9 %   MCV 93.5 80.0 - 100.0 fL   MCH 30.4 26.0 - 34.0 pg   MCHC 32.5 30.0 - 36.0 g/dL   RDW 38.1 82.9 - 93.7 %   Platelets 199 150 - 400 K/uL   nRBC 0.0 0.0 - 0.2 %   Neutrophils Relative % 80 %   Neutro Abs 9.7 (H) 1.7 - 7.7 K/uL   Lymphocytes Relative 10 %   Lymphs Abs 1.3 0.7 - 4.0 K/uL   Monocytes Relative 8 %   Monocytes Absolute 0.9 0.1 - 1.0 K/uL   Eosinophils Relative 1 %   Eosinophils Absolute 0.1 0.0 - 0.5 K/uL   Basophils Relative 0 %   Basophils Absolute 0.0 0.0 - 0.1 K/uL   Immature Granulocytes 1 %   Abs Immature Granulocytes 0.06 0.00 - 0.07 K/uL  Basic metabolic panel     Status: Abnormal   Collection Time: 10/05/20  5:20 AM  Result Value Ref Range   Sodium 131 (L) 135 - 145 mmol/L   Potassium 4.5 3.5 - 5.1 mmol/L   Chloride 95 (L) 98 - 111 mmol/L   CO2 28 22 - 32 mmol/L   Glucose, Bld 155 (H) 70 - 99 mg/dL   BUN 45 (H) 8 - 23 mg/dL   Creatinine, Ser 10/07/20 (H) 0.44 - 1.00 mg/dL   Calcium 8.3 (L) 8.9 - 10.3 mg/dL   GFR, Estimated 34 (L) >60 mL/min   Anion gap 8 5 - 15    DG HIP OPERATIVE  UNILAT W OR W/O PELVIS LEFT  Result Date: 10/04/2020 CLINICAL DATA:  LEFT total hip replacement. EXAM: OPERATIVE LEFT HIP (WITH PELVIS IF PERFORMED) 1 VIEWS TECHNIQUE: Fluoroscopic spot image(s) were submitted for interpretation post-operatively. COMPARISON:  Preop evaluation September 30, 2020 FINDINGS: Single intraoperative image is displayed showing signs of LEFT total hip replacement, acetabulum is incompletely visualized along the superior margin of the acetabular component. No signs of immediate complication on very limited AP view. Very limited imaging of the adjacent pelvis is provided. Tip of the femoral component is not imaged. FLUOROSCOPY TIME:  7 seconds. IMPRESSION: Intraoperative evaluation of LEFT hip arthroplasty with no apparent complication. LEFT femoral component is incompletely imaged. Acetabular component also incompletely assessed with respect to the adjacent acetabulum along its superior margin. Attention on subsequent imaging is suggested. Electronically Signed   By: October 02, 2020 M.D.   On: 10/04/2020 19:26  Assessment/Plan: 1 Day Post-Op   Principal Problem:   Fracture of femoral neck, left (HCC) Active Problems:   Heart failure with reduced ejection fraction (HCC)   Permanent atrial fibrillation (HCC)   Up with therapy Discharge to SNF OK from ortho standpoint Restarted Eliquis today RTC 2 weeks for staples WBAT left lower extremity   Lyndle Herrlich , MD 10/05/2020, 12:05 PM

## 2020-10-05 NOTE — Evaluation (Signed)
Physical Therapy Evaluation Patient Details Name: Heather [name] MRN: [id number] DOB: 04/28/1932 Today's Date: 10/05/2020   History of Present Illness  Patient is an 87 year old female with medical history significant for atrial fibrillation on Eliquis, hypertension, hypothyroidism, hyperlipidemia, anxiety, depression, who presented to the hospital because of severe left hip pain following a mechanical fall at home. Found to have displaced Subcapital fracture left hip s/p left anterior hip hemiarthroplasty.  Clinical Impression  Patient groggy at times but agreeable to PT with encouragement. Patient was premedicated before PT session. Patient required Max A for bed mobility. Max A +2 person assistance required for standing with difficulty sequencing and fear of falling. Unable to safely progress to ambulation at this time due to poor standing tolerance. Patient was previously independent with activity. Recommend PT to maximize independence and facilitate return to prior level of function. SNF is recommended and family is requesting [facility] as [facility] choice.     Follow Up Recommendations SNF    Equipment Recommendations   (to be determined at next level of care)    Recommendations for Other Services       Precautions / Restrictions Precautions Precautions: Fall (anterior hip hemiarthropasty) Restrictions Weight Bearing Restrictions: Yes LLE Weight Bearing: Weight bearing as tolerated      Mobility  Bed Mobility Overal bed mobility: Needs Assistance Bed Mobility: Supine to Sit;Sit to Supine     Supine to sit: Max assist Sit to supine: Max assist   General bed mobility comments: verbal cues for sequencing and technique. assistance for LE and trunk support. increased time for motor planning    Transfers Overall transfer level: Needs assistance Equipment used: Rolling walker (2 wheeled) Transfers: Sit to/from Stand;Lateral/Scoot Transfers Sit to Stand: Max assist;+2 physical  assistance        Lateral/Scoot Transfers: Max assist (for incremental scooting towards left in sitting position) General transfer comment: verbal cues for hand placement and technique for standing. verbal cues for technique. lifting and lowering assistance provided. patient unable to stand with first attempt, but was able to stand with second attempt. maximal encouragement provided as patient with fear of falling. no reported increased pain with standing  Ambulation/Gait             General Gait Details: unable to stand long enough for safety attempt at ambulation, also with poor standing tolerance  Stairs            Wheelchair Mobility    Modified Rankin (Stroke Patients Only)       Balance Overall balance assessment: Needs assistance Sitting-balance support: Feet supported;Bilateral upper extremity supported Sitting balance-Leahy Scale: Fair Sitting balance - Comments: Min A initially progressing to stand by assistance with increased sitting time   Standing balance support: Bilateral upper extremity supported Standing balance-Leahy Scale: Poor Standing balance comment: mod A required to maintain standing balance                             Pertinent Vitals/Pain Pain Assessment: 0-10 Pain Score: 5  Pain Location: left hip Pain Descriptors / Indicators: Discomfort Pain Intervention(s): Limited activity within patient's tolerance;Monitored during session    Home Living Family/patient expects to be discharged to:: Private residence Living Arrangements: Children Available Help at Discharge: Family;Available PRN/intermittently Type of Home:  (lives in the basement apartment of her daughter's home) Home Access: Level entry     Home Layout: Able to live on main level with bedroom/bathroom Home  Equipment: Dan Humphreys - 2 wheels;Shower seat      Prior Function Level of Independence: Independent         Comments: patient reports 2 falls in the past 6  months     Hand Dominance        Extremity/Trunk Assessment   Upper Extremity Assessment Upper Extremity Assessment: Generalized weakness    Lower Extremity Assessment Lower Extremity Assessment: RLE deficits/detail;LLE deficits/detail RLE Deficits / Details: generalized weakness LLE Deficits / Details: hip abduction pillow in place on arrival to room. patient able to actviate hip, knee, ankle movement. guarding due to pain LLE Sensation: WNL       Communication   Communication: No difficulties  Cognition Arousal/Alertness: Awake/alert (lethargic at times) Behavior During Therapy: WFL for tasks assessed/performed Overall Cognitive Status: Within Functional Limits for tasks assessed                                 General Comments: patient does have mild delay with motor planning and needs increased cueing for following single step commands at times      General Comments      Exercises General Exercises - Lower Extremity Ankle Circles/Pumps: AROM;Strengthening;Left;10 reps;Supine Quad Sets: AROM;Strengthening;Left;10 reps;Supine Other Exercises Other Exercises: verbal cues for exercise technique   Assessment/Plan    PT Assessment Patient needs continued PT services  PT Problem List Decreased strength;Decreased range of motion;Decreased activity tolerance;Decreased balance;Decreased cognition;Decreased knowledge of use of DME;Decreased safety awareness;Pain       PT Treatment Interventions DME instruction;Gait training;Stair training;Functional mobility training;Therapeutic activities;Therapeutic exercise;Balance training;Neuromuscular re-education;Patient/family education    PT Goals (Current goals can be found in the Care Plan section)  Acute Rehab PT Goals Patient Stated Goal: none stated PT Goal Formulation: With patient/family Time For Goal Achievement: 10/19/20 Potential to Achieve Goals: Fair    Frequency BID   Barriers to discharge         Co-evaluation               AM-PAC PT "6 Clicks" Mobility  Outcome Measure Help needed turning from your back to your side while in a flat bed without using bedrails?: A Lot Help needed moving from lying on your back to sitting on the side of a flat bed without using bedrails?: A Lot Help needed moving to and from a bed to a chair (including a wheelchair)?: Total Help needed standing up from a chair using your arms (e.g., wheelchair or bedside chair)?: Total Help needed to walk in hospital room?: Total Help needed climbing 3-5 steps with a railing? : Total 6 Click Score: 8    End of Session Equipment Utilized During Treatment: Gait belt Activity Tolerance: Patient limited by fatigue Patient left: in bed;with call bell/phone within reach;with bed alarm set;with SCD's reapplied;with family/visitor present (hip abduction pillow re-applied) Nurse Communication: Mobility status PT Visit Diagnosis: Unsteadiness on feet (R26.81);Muscle weakness (generalized) (M62.81);Other abnormalities of gait and mobility (R26.89);Pain Pain - Right/Left: Left Pain - part of body: Hip    Time: 0907-1001 PT Time Calculation (min) (ACUTE ONLY): 54 min   Charges:   PT Evaluation $PT Eval Moderate Complexity: 1 Mod PT Treatments $Therapeutic Activity: 23-37 mins        Donna Bernard, PT, MPT   Ina Homes 10/05/2020, 11:08 AM

## 2020-10-05 NOTE — Progress Notes (Addendum)
Progress Note    Heather [name]  OBS:[id number] DOB: [birth year]  DOA: 09/30/2020 PCP: Barbette Reichmann, MD      Brief Narrative:    Medical records reviewed and are as summarized below:  Heather [name] is a 87 y.o. female is an 87 year old woman with medical history significant for atrial fibrillation on Eliquis, hypertension, hypothyroidism, hyperlipidemia, anxiety, depression, chronic systolic CHF, who presented to the hospital because of severe left hip pain following a mechanical fall at home.  She was found to have closed left hip fracture.  She was taking Eliquis prior to admission so this was held prior to surgery.  She was seen by orthopedic surgeon.  She underwent left hip hemiarthroplasty on 10/04/2020.  PT and OT recommend discharge to SNF.      Assessment/Plan:   Principal Problem:   Fracture of femoral neck, left (HCC) Active Problems:   Heart failure with reduced ejection fraction (HCC)   Permanent atrial fibrillation (HCC)   Body mass index is 34.01 kg/m.  (Obesity)   Closed left hip fracture: s/p left hip hemiarthroplasty on 10/04/2020.  Continue analgesics as needed for pain.  Follow-up with orthopedic surgeon.  PT and OT recommend discharge to SNF.    Acute urinary retention: Improved.  Foley catheter has been removed.  Permanent atrial fibrillation: Continue metoprolol.  Eliquis on hold.  Resume Eliquis when okay with orthopedic surgeon.    Chronic systolic CHF (EF 40 to 45% December 2017): Continue torsemide  Prolonged QTc interval: Improved 560 to 543.  This is likely due to ventricular paced rhythm on EKG.  Other comorbidities include status post permanent pacemaker, hypothyroidism, anxiety, depression, hyperlipidemia, hypertension, CKD stage IIIb  Diet Order            Diet regular Room service appropriate? Yes; Fluid consistency: Thin  Diet effective now                    Consultants:  Orthopedic surgeon  Procedures:  Left  hip hemiarthroplasty on 10/04/2020    Medications:   . apixaban  5 mg Oral Daily  . Chlorhexidine Gluconate Cloth  6 each Topical Daily  . docusate sodium  100 mg Oral BID  . levothyroxine  50 mcg Oral Daily  . magnesium oxide  400 mg Oral Daily  . metoprolol succinate  50 mg Oral Daily  . multivitamin with minerals  1 tablet Oral Daily  . potassium chloride  10 mEq Oral Daily  . senna  1 tablet Oral QHS  . sertraline  50 mg Oral Daily  . torsemide  20 mg Oral Daily  . tranexamic acid (CYKLOKAPRON) topical - INTRAOP  2,000 mg Topical Once   Continuous Infusions: . sodium chloride 10 mL/hr at 10/05/20 0455     Anti-infectives (From admission, onward)   Start     Dose/Rate Route Frequency Ordered Stop   10/04/20 2200  ceFAZolin (ANCEF) IVPB 2g/100 mL premix        2 g 200 mL/hr over 30 Minutes Intravenous Every 6 hours 10/04/20 1849 10/05/20 0859   10/04/20 1342  ceFAZolin (ANCEF) 2-4 GM/100ML-% IVPB       Note to Pharmacy: [place], [place]   : cabinet override      10/04/20 1342 10/04/20 1606   10/04/20 1214  ceFAZolin (ANCEF) IVPB 2g/100 mL premix        2 g 200 mL/hr over 30 Minutes Intravenous 30 min pre-op 10/04/20 1214 10/04/20 1625  Family Communication/Anticipated D/C date and plan/Code Status   DVT prophylaxis: SCDs Start: 10/04/20 1736 Place and maintain sequential compression device Start: 09/30/20 1953     Code Status: Full Code  Family Communication: None Disposition Plan:    Status is: Inpatient  Remains inpatient appropriate because:Unsafe d/c plan and Inpatient level of care appropriate due to severity of illness   Dispo:  Patient From: Home  Planned Disposition: SNF  Medically stable for discharge: No                Subjective:   C/o left hip pain   Objective:    Vitals:   10/05/20 0428 10/05/20 0458 10/05/20 0835 10/05/20 1134  BP: 131/60  122/61 98/76  Pulse: 72  70 69  Resp: 14  15 18   Temp: 98.2 F  (36.8 C)  98.3 F (36.8 C) 98.5 F (36.9 C)  TempSrc: Oral     SpO2: 91%  92% 100%  Weight:  92.7 kg    Height:       No data found.   Intake/Output Summary (Last 24 hours) at 10/05/2020 1548 Last data filed at 10/05/2020 1500 Gross per 24 hour  Intake 1953.68 ml  Output 1025 ml  Net 928.68 ml   Filed Weights   10/02/20 0300 10/04/20 0532 10/05/20 0458  Weight: 93.4 kg 94 kg 92.7 kg    Exam:  GEN: NAD SKIN: No rash EYES: Warm and dry ENT: MMM CV: RRR PULM: CTA B ABD: soft, ND, NT, +BS CNS: AAO x 3, non focal EXT: Left hip tenderness.           Data Reviewed:   I have personally reviewed following labs and imaging studies:  Labs: Labs show the following:   Basic Metabolic Panel: Recent Labs  Lab 09/30/20 1742 10/01/20 0524 10/02/20 0524 10/05/20 0520  NA 140 137 134* 131*  K 3.6 4.2 4.2 4.5  CL 103 101 98 95*  CO2 29 28 28 28   GLUCOSE 99 122* 114* 155*  BUN 31* 28* 28* 45*  CREATININE 1.36* 1.21* 1.35* 1.48*  CALCIUM 9.0 9.0 9.0 8.3*  MG  --  2.2  --   --   PHOS  --  3.0  --   --    GFR Estimated Creatinine Clearance: 30.7 mL/min (A) (by C-G formula based on SCr of 1.48 mg/dL (H)). Liver Function Tests: Recent Labs  Lab 09/30/20 1742  AST 26  ALT 17  ALKPHOS 69  BILITOT 0.8  PROT 7.5  ALBUMIN 3.9   No results for input(s): LIPASE, AMYLASE in the last 168 hours. No results for input(s): AMMONIA in the last 168 hours. Coagulation profile Recent Labs  Lab 09/30/20 1742  INR 1.2    CBC: Recent Labs  Lab 09/30/20 1742 10/01/20 0634 10/02/20 0524 10/05/20 0520  WBC 11.0* 13.1* 12.0* 12.1*  NEUTROABS 8.3*  --  8.8* 9.7*  HGB 12.2 11.2* 11.1* 9.4*  HCT 37.6 33.8* 33.0* 28.9*  MCV 94.2 93.1 93.5 93.5  PLT 211 194 177 199   Cardiac Enzymes: No results for input(s): CKTOTAL, CKMB, CKMBINDEX, TROPONINI in the last 168 hours. BNP (last 3 results) No results for input(s): PROBNP in the last 8760 hours. CBG: No results for  input(s): GLUCAP in the last 168 hours. D-Dimer: No results for input(s): DDIMER in the last 72 hours. Hgb A1c: No results for input(s): HGBA1C in the last 72 hours. Lipid Profile: No results for input(s): CHOL, HDL, LDLCALC, TRIG, CHOLHDL, LDLDIRECT  in the last 72 hours. Thyroid function studies: No results for input(s): TSH, T4TOTAL, T3FREE, THYROIDAB in the last 72 hours.  Invalid input(s): FREET3 Anemia work up: No results for input(s): VITAMINB12, FOLATE, FERRITIN, TIBC, IRON, RETICCTPCT in the last 72 hours. Sepsis Labs: Recent Labs  Lab 09/30/20 1742 10/01/20 0634 10/02/20 0524 10/05/20 0520  WBC 11.0* 13.1* 12.0* 12.1*    Microbiology Recent Results (from the past 240 hour(s))  SARS CORONAVIRUS 2 (TAT 6-24 HRS) Nasopharyngeal Nasopharyngeal Swab     Status: None   Collection Time: 10/01/20  1:32 AM   Specimen: Nasopharyngeal Swab  Result Value Ref Range Status   SARS Coronavirus 2 NEGATIVE NEGATIVE Final    Comment: (NOTE) SARS-CoV-2 target nucleic acids are NOT DETECTED.  The SARS-CoV-2 RNA is generally detectable in upper and lower respiratory specimens during the acute phase of infection. Negative results do not preclude SARS-CoV-2 infection, do not rule out co-infections with other pathogens, and should not be used as the sole basis for treatment or other patient management decisions. Negative results must be combined with clinical observations, patient history, and epidemiological information. The expected result is Negative.  Fact Sheet for Patients: HairSlick.no  Fact Sheet for Healthcare Providers: quierodirigir.com  This test is not yet approved or cleared by the [place] FDA and  has been authorized for detection and/or diagnosis of SARS-CoV-2 by FDA under an Emergency Use Authorization (EUA). This EUA will remain  in effect (meaning this test can be used) for the duration of the COVID-19  declaration under Se ction 564(b)(1) of the Act, 21 U.S.C. section 360bbb-3(b)(1), unless the authorization is terminated or revoked sooner.  Performed at [facility], [address], [place], [place] [address]     Procedures and diagnostic studies:  DG HIP OPERATIVE UNILAT W OR W/O PELVIS LEFT  Result Date: 10/04/2020 CLINICAL DATA:  LEFT total hip replacement. EXAM: OPERATIVE LEFT HIP (WITH PELVIS IF PERFORMED) 1 VIEWS TECHNIQUE: Fluoroscopic spot image(s) were submitted for interpretation post-operatively. COMPARISON:  Preop evaluation September 30, 2020 FINDINGS: Single intraoperative image is displayed showing signs of LEFT total hip replacement, acetabulum is incompletely visualized along the superior margin of the acetabular component. No signs of immediate complication on very limited AP view. Very limited imaging of the adjacent pelvis is provided. Tip of the femoral component is not imaged. FLUOROSCOPY TIME:  7 seconds. IMPRESSION: Intraoperative evaluation of LEFT hip arthroplasty with no apparent complication. LEFT femoral component is incompletely imaged. Acetabular component also incompletely assessed with respect to the adjacent acetabulum along its superior margin. Attention on subsequent imaging is suggested. Electronically Signed   By: Donzetta Kohut M.D.   On: 10/04/2020 19:26               LOS: 5 days   Olumide Dolinger  [facility]   Pager on [email or web address]. If 7PM-7AM, please contact night-coverage at [email or web address]     10/05/2020, 3:48 PM

## 2020-10-06 LAB — BASIC METABOLIC PANEL
Anion gap: 8 (ref 5–15)
BUN: 43 mg/dL — ABNORMAL HIGH (ref 8–23)
CO2: 28 mmol/L (ref 22–32)
Calcium: 8.5 mg/dL — ABNORMAL LOW (ref 8.9–10.3)
Chloride: 93 mmol/L — ABNORMAL LOW (ref 98–111)
Creatinine, Ser: 1.53 mg/dL — ABNORMAL HIGH (ref 0.44–1.00)
GFR, Estimated: 33 mL/min — ABNORMAL LOW (ref >60)
Glucose, Bld: 113 mg/dL — ABNORMAL HIGH (ref 70–99)
Potassium: 4.7 mmol/L (ref 3.5–5.1)
Sodium: 129 mmol/L — ABNORMAL LOW (ref 135–145)

## 2020-10-06 NOTE — Progress Notes (Signed)
Physical Therapy Treatment Patient Details Name: Heather [name] MRN: [id number] DOB: 1931/09/11 Today's Date: 10/06/2020    History of Present Illness Patient is an 87 year old female with medical history significant for atrial fibrillation on Eliquis, hypertension, hypothyroidism, hyperlipidemia, anxiety, depression, who presented to the hospital because of severe left hip pain following a mechanical fall at home. Found to have displaced Subcapital fracture left hip s/p left anterior hip hemiarthroplasty.    PT Comments    Pt was asleep in long sitting upon arriving. He agrees to PT session and is cooperative throughout. She is A and cooperative however is disoriented. Per supportive son/granddaughter in room, " cognition is not at baseline but better than yesterday." She is very fearful of all movements and extremely fearful of falling throughout session. Was able to exit bed with max assist of one, stand 2 x EOB (elevated bed height) prior to taking 3 steps to turn to sit in recliner. Therapist did support LLE from buckling during RLE advancement. Poor eccentric controlled lowering with stand to sit. Pt's anxiety most limiting progression of session. Acute PT will continue to follow and progress as able per POC. Will return for PM session. Acute PT highly recommends DC to SNF to address deficits while assisting pt to PLOF. All parties are in agreement with DC plan.   Follow Up Recommendations  SNF;Other (comment) (Pt and family agreeable to PT session.)     Equipment Recommendations  Other (comment) (defer to next level of care)       Precautions / Restrictions Precautions Precautions: Fall Precaution Booklet Issued: Yes (comment) Restrictions Weight Bearing Restrictions: Yes LLE Weight Bearing: Weight bearing as tolerated    Mobility  Bed Mobility Overal bed mobility: Needs Assistance Bed Mobility: Supine to Sit;Sit to Supine     Supine to sit: Max assist     General bed  mobility comments: Max assist to exit R side of bed with increased time and vcs    Transfers Overall transfer level: Needs assistance Equipment used: Rolling walker (2 wheeled) Transfers: Sit to/from Stand Sit to Stand: Max assist;From elevated surface         General transfer comment: Pt was able to stand 2 x from elevated EOB height. Vcs for technique and sequencing.  Ambulation/Gait Ambulation/Gait assistance: Max assist Gait Distance (Feet): 3 Feet Assistive device: Rolling walker (2 wheeled) Gait Pattern/deviations: Step-to pattern Gait velocity: decreased   General Gait Details: poor standing balance, unable to advance ambulation at this time       Balance Overall balance assessment: Needs assistance Sitting-balance support: Feet supported;Bilateral upper extremity supported Sitting balance-Leahy Scale: Fair     Standing balance support: Bilateral upper extremity supported Standing balance-Leahy Scale: Poor         Cognition Arousal/Alertness: Awake/alert Behavior During Therapy: WFL for tasks assessed/performed Overall Cognitive Status: Within Functional Limits for tasks assessed      General Comments: Pt is A but disoriented             Pertinent Vitals/Pain Pain Assessment: 0-10 Pain Score:  (did not rate) Pain Location: left hip Pain Descriptors / Indicators: Discomfort Pain Intervention(s): Limited activity within patient's tolerance           PT Goals (current goals can now be found in the care plan section) Acute Rehab PT Goals Patient Stated Goal: none stated Progress towards PT goals: Progressing toward goals    Frequency    BID      PT Plan Current  plan remains appropriate       AM-PAC PT "6 Clicks" Mobility   Outcome Measure  Help needed turning from your back to your side while in a flat bed without using bedrails?: A Lot Help needed moving from lying on your back to sitting on the side of a flat bed without using  bedrails?: A Lot Help needed moving to and from a bed to a chair (including a wheelchair)?: A Lot Help needed standing up from a chair using your arms (e.g., wheelchair or bedside chair)?: A Lot Help needed to walk in hospital room?: A Lot Help needed climbing 3-5 steps with a railing? : Total 6 Click Score: 11    End of Session Equipment Utilized During Treatment: Gait belt Activity Tolerance: Patient tolerated treatment well Patient left: in chair;with call bell/phone within reach;with chair alarm set;with family/visitor present Nurse Communication: Mobility status PT Visit Diagnosis: Unsteadiness on feet (R26.81);Muscle weakness (generalized) (M62.81);Other abnormalities of gait and mobility (R26.89);Pain Pain - Right/Left: Left Pain - part of body: Hip     Time: 9935-7017 PT Time Calculation (min) (ACUTE ONLY): 31 min  Charges:  $Therapeutic Activity: 23-37 mins                     Jetta Lout PTA 10/06/20, 12:43 PM

## 2020-10-06 NOTE — TOC Initial Note (Addendum)
Transition of Care [facility]) - Initial/Assessment Note    Patient Details  Name: Heather [name] MRN: [id number] Date of Birth: 1932/05/12  Transition of Care [facility] CM/SW Contact:    Candie Chroman, LCSW Phone Number: 10/06/2020, 2:47 PM  Clinical Narrative: CSW met with patient. Son and female family member at bedside. CSW introduced role and explained that PT recommendations would be discussed. Patient agreeable to SNF placement. First preference is [facility] but patient has received only one COVID vaccine so unfortunately they would not accept her. Gave CMS scores for other local SNF's. Will follow up with bed offers. No further concerns. CSW encouraged patient and her family to contact CSW as needed. CSW will continue to follow patient and her family for support and facilitate discharge to SNF once medically stable.        3:42 pm: PASARR obtained: [id number]. Expires 11/05/20.         Expected Discharge Plan: Skilled Nursing Facility Barriers to Discharge: [place] (PASRR),Continued Medical Work up   Patient Goals and CMS Choice   CMS Medicare.gov Compare Post Acute Care list provided to:: Patient (Family at bedside)    Expected Discharge Plan and Services Expected Discharge Plan: [place] Acute Care Choice: [place] Living arrangements for the past 2 months: Single Family Home                                      Prior Living Arrangements/Services Living arrangements for the past 2 months: Single Family Home Lives with:: Adult Children Patient language and need for interpreter reviewed:: Yes Do you feel safe going back to the place where you live?: Yes      Need for Family Participation in Patient Care: Yes (Comment) Care giver support system in place?: Yes (comment)   Criminal Activity/Legal Involvement Pertinent to Current Situation/Hospitalization: No - Comment as needed  Activities of Daily Living Home  Assistive Devices/Equipment: Cane (specify quad or straight),Eyeglasses ADL Screening (condition at time of admission) Patient's cognitive ability adequate to safely complete daily activities?: Yes Is the patient deaf or have difficulty hearing?: No Does the patient have difficulty seeing, even when wearing glasses/contacts?: No Does the patient have difficulty concentrating, remembering, or making decisions?: No Patient able to express need for assistance with ADLs?: Yes Does the patient have difficulty dressing or bathing?: No Independently performs ADLs?: Yes (appropriate for developmental age) Does the patient have difficulty walking or climbing stairs?: Yes Weakness of Legs: Both Weakness of Arms/Hands: Left  Permission Sought/Granted Permission sought to share information with : Facility Contact Representative,Family Supports Permission granted to share information with : Yes, Verbal Permission Granted  Share Information with NAME: Tonae Livolsi  Permission granted to share info w AGENCY: SNF's  Permission granted to share info w Relationship: Son  Permission granted to share info w Contact Information: [phone]  Emotional Assessment Appearance:: Appears stated age Attitude/Demeanor/Rapport: Engaged,Gracious Affect (typically observed): Accepting,Appropriate,Calm,Pleasant Orientation: : Oriented to Self,Oriented to Place,Oriented to  Time,Oriented to Situation Alcohol / Substance Use: Not Applicable Psych Involvement: No (comment)  Admission diagnosis:  Fracture of femoral neck, left (HCC) [S72.002A] Closed fracture of left hip, initial encounter ([place]) [S72.002A] Closed left hip fracture, initial encounter [facility]) [S72.002A] Patient Active Problem List   Diagnosis Date Noted  . Fracture of femoral neck, left ([place]) 09/30/2020  . Macular degeneration of both eyes 06/27/2018  .  Moderate episode of recurrent major depressive disorder ([place]) 06/27/2018  . Depression 10/16/2017  .  Permanent atrial fibrillation ([place]) 09/26/2017  . Benign essential HTN 03/27/2017  . Heart failure with reduced ejection fraction ([place]) 07/19/2016  . Hypertensive heart disease with congestive heart failure ([place]) 07/19/2016  . CAP (community acquired pneumonia) 06/28/2016  . Anxiety 06/28/2016  . HTN (hypertension) 06/28/2016  . Hypothyroidism 06/28/2016  . GERD (gastroesophageal reflux disease) 06/28/2016  . Cough 03/28/2015  . Hyperlipidemia 02/10/2013  . Menopausal syndrome 02/10/2013  . Obesity 02/10/2013   PCP:  Tracie Harrier, MD Pharmacy:   [id number] [place] [id number]: [phone]: [phone]    Social Determinants of Health (SDOH) Interventions    Readmission Risk Interventions No flowsheet data found.

## 2020-10-06 NOTE — NC FL2 (Signed)
[place] MEDICAID FL2 LEVEL OF CARE SCREENING TOOL     IDENTIFICATION  Patient Name: Heather [name] Birthdate: [birth year] Sex: female Admission Date (Current Location): 09/30/2020  [facility] and [place] Number:  Chiropodist and Address:  [facility], [address] [place], [place] [address]      Provider Number: [id number]  Attending Physician Name and Address:  Tresa Moore, MD  Relative Name and Phone Number:       Current Level of Care: Hospital Recommended Level of Care: Skilled Nursing Facility Prior Approval Number:    Date Approved/Denied:   PASRR Number: Manual review  Discharge Plan: SNF    Current Diagnoses: Patient Active Problem List   Diagnosis Date Noted  . Fracture of femoral neck, left (HCC) 09/30/2020  . Macular degeneration of both eyes 06/27/2018  . Moderate episode of recurrent major depressive disorder (HCC) 06/27/2018  . Depression 10/16/2017  . Permanent atrial fibrillation (HCC) 09/26/2017  . Benign essential HTN 03/27/2017  . Heart failure with reduced ejection fraction (HCC) 07/19/2016  . Hypertensive heart disease with congestive heart failure (HCC) 07/19/2016  . CAP (community acquired pneumonia) 06/28/2016  . Anxiety 06/28/2016  . HTN (hypertension) 06/28/2016  . Hypothyroidism 06/28/2016  . GERD (gastroesophageal reflux disease) 06/28/2016  . Cough 03/28/2015  . Hyperlipidemia 02/10/2013  . Menopausal syndrome 02/10/2013  . Obesity 02/10/2013    Orientation RESPIRATION BLADDER Height & Weight     Self,Time,Situation,Place  O2 Incontinent,Indwelling catheter Weight: 204 lb 5.9 oz (92.7 kg) Height:  5\' 5"  (165.1 cm)  BEHAVIORAL SYMPTOMS/MOOD NEUROLOGICAL BOWEL NUTRITION STATUS   (None)  (None) Continent Diet (Regular)  AMBULATORY STATUS COMMUNICATION OF NEEDS Skin   Extensive Assist Verbally Surgical wounds,Bruising (Incision on left hip: Surgical dressing.)                        Personal Care Assistance Level of Assistance              Functional Limitations Info  Sight,Hearing,Speech Sight Info: Adequate Hearing Info: Adequate Speech Info: Adequate    SPECIAL CARE FACTORS FREQUENCY  PT (By licensed PT)     PT Frequency: 5 x week              Contractures Contractures Info: Not present    Additional Factors Info  Code Status,Allergies Code Status Info: Full code Allergies Info: Pollen, Codeine, Sulfa Antibiotics, Sulfamethoxazole-trimethoprim.           Current Medications (10/06/2020):  This is the current hospital active medication list Current Facility-Administered Medications  Medication Dose Route Frequency Provider Last Rate Last Admin  . 0.9 %  sodium chloride infusion   Intravenous PRN 10/08/2020, MD 10 mL/hr at 10/05/20 0455 Restarted at 10/05/20 0455  . acetaminophen (TYLENOL) tablet 650 mg  650 mg Oral Q6H PRN 10/07/20, MD   650 mg at 10/06/20 1000  . apixaban (ELIQUIS) tablet 5 mg  5 mg Oral BID 10/08/20, MD   5 mg at 10/06/20 1010  . Chlorhexidine Gluconate Cloth 2 % PADS 6 each  6 each Topical Daily 10/08/20, MD   6 each at 10/03/20 0802  . docusate sodium (COLACE) capsule 100 mg  100 mg Oral BID 10/05/20, MD   100 mg at 10/06/20 0955  . fentaNYL (SUBLIMAZE) injection 50 mcg  50 mcg Intravenous Q2H PRN 10/08/20, MD   50 mcg at 10/02/20 0155  .  levothyroxine (SYNTHROID) tablet 50 mcg  50 mcg Oral Daily Lyndle Herrlich, MD   50 mcg at 10/06/20 6606  . magnesium oxide (MAG-OX) tablet 400 mg  400 mg Oral Daily Lyndle Herrlich, MD   400 mg at 10/06/20 0954  . metoCLOPramide (REGLAN) tablet 5-10 mg  5-10 mg Oral Q8H PRN Lyndle Herrlich, MD       Or  . metoCLOPramide (REGLAN) injection 5-10 mg  5-10 mg Intravenous Q8H PRN Lyndle Herrlich, MD   10 mg at 10/05/20 0113  . metoprolol succinate (TOPROL-XL) 24 hr tablet 50 mg  50 mg Oral Daily Lyndle Herrlich, MD   50 mg at 10/06/20 3016  .  multivitamin with minerals tablet 1 tablet  1 tablet Oral Daily Lyndle Herrlich, MD   1 tablet at 10/06/20 [id number]  . ondansetron (ZOFRAN) tablet 4 mg  4 mg Oral Q6H PRN Lyndle Herrlich, MD       Or  . ondansetron [facility]) injection 4 mg  4 mg Intravenous Q6H PRN Lyndle Herrlich, MD   4 mg at 10/04/20 2341  . oxyCODONE (Oxy IR/ROXICODONE) immediate release tablet 5 mg  5 mg Oral Q6H PRN Lyndle Herrlich, MD   5 mg at 10/05/20 3235  . potassium chloride (KLOR-CON) CR tablet 10 mEq  10 mEq Oral Daily Lyndle Herrlich, MD   10 mEq at 10/06/20 0955  . prochlorperazine (COMPAZINE) injection 10 mg  10 mg Intravenous Q6H PRN Lyndle Herrlich, MD   10 mg at 09/30/20 2036  . senna (SENOKOT) tablet 8.6 mg  1 tablet Oral QHS Lyndle Herrlich, MD   8.6 mg at 10/05/20 2208  . sertraline (ZOLOFT) tablet 50 mg  50 mg Oral Daily Lyndle Herrlich, MD   50 mg at 10/06/20 5732  . torsemide (DEMADEX) tablet 20 mg  20 mg Oral Daily Lyndle Herrlich, MD   20 mg at 10/06/20 2025     Discharge Medications: Please see discharge summary for a list of discharge medications.  Relevant Imaging Results:  Relevant Lab Results:   Additional Information SS#: [id number]. Has only had one COVID vaccine.  Margarito Liner, LCSW

## 2020-10-06 NOTE — Clinical Social Work Note (Signed)
RE: Heather [name] Date of Birth: [birth year] Date: 10/06/2020   To Whom It May Concern:  Please be advised that the above-named patient will require a short-term nursing home stay - anticipated 30 days or less for rehabilitation and strengthening.  The plan is for return home.

## 2020-10-06 NOTE — Progress Notes (Signed)
PROGRESS NOTE    Heather [name]  QJJ:[id number] DOB: 12/31/1931 DOA: 09/30/2020 PCP: Barbette Reichmann, MD  Brief Narrative:  87 y.o. female is an 87 year old woman with medical history significant for atrial fibrillation on Eliquis, hypertension, hypothyroidism, hyperlipidemia, anxiety, depression, chronic systolic CHF, who presented to the hospital because of severe left hip pain following a mechanical fall at home.  She was found to have closed left hip fracture.  She was taking Eliquis prior to admission so this was held prior to surgery.  She was seen by orthopedic surgeon.  She underwent left hip hemiarthroplasty on 10/04/2020.  PT and OT recommend discharge to SNF.  Eliquis restarted postoperatively.  Currently pending placement to skilled nursing facility.  TOC aware   Assessment & Plan:   Principal Problem:   Fracture of femoral neck, left (HCC) Active Problems:   Heart failure with reduced ejection fraction (HCC)   Permanent atrial fibrillation (HCC)  Closed left hip fracture  s/p left hip hemiarthroplasty on 10/04/2020.  Pain well controlled Eliquis restarted Plan: Up with therapy TOC consult for SNF placement Multimodal pain control Bowel regimen  Acute urinary retention  Improved.  Foley catheter has been removed.  Acute kidney injury Suspect dehydration Encourage p.o. fluid intake Recheck creatinine in a.m.  Acute on chronic postoperative blood loss anemia Hemoglobin 9.4 as of 3/22 No clear source of blood loss No oxygen demand No indication for transfusion currently Okay for resume Eliquis  Permanent atrial fibrillation  Continue metoprolol.   Eliquis restarted 3/23  Chronic systolic CHF  EF 40 to 45% December 2017 Continue torsemide Monitor kidney function   DVT prophylaxis: Eliquis Code Status: Full Family Communication: Family member at bedside Disposition Plan: Status is: Inpatient  Remains inpatient appropriate because:Inpatient level  of care appropriate due to severity of illness   Dispo:  Patient From: Home  Planned Disposition: Skilled Nursing Facility  Medically stable for discharge: No         Level of care: Med-Surg  Consultants:   Orthopedics  Procedures:   Hip fracture repair  Antimicrobials:   None   Subjective: Seen and examined.  Family member at bedside.  Pain well controlled.  No specific complaints.  Pending placement to skilled nursing facility.  Objective: Vitals:   10/06/20 0513 10/06/20 0741 10/06/20 1107 10/06/20 1122  BP: (!) 123/53 (!) 119/57 (!) 180/78 (!) 136/57  Pulse: 74 69 68 70  Resp: 18 18  15   Temp: 98.3 F (36.8 C) 98.5 F (36.9 C)  99 F (37.2 C)  TempSrc:  Oral  Oral  SpO2: 92% 96% 100% 93%  Weight:      Height:        Intake/Output Summary (Last 24 hours) at 10/06/2020 1537 Last data filed at 10/06/2020 1442 Gross per 24 hour  Intake 360 ml  Output 400 ml  Net -40 ml   Filed Weights   10/02/20 0300 10/04/20 0532 10/05/20 0458  Weight: 93.4 kg 94 kg 92.7 kg    Examination:  General exam: Appears calm and comfortable  Respiratory system: Clear to auscultation. Respiratory effort normal. Cardiovascular system: S1 & S2 heard, RRR. No JVD, murmurs, rubs, gallops or clicks. No pedal edema. Gastrointestinal system: Abdomen is nondistended, soft and nontender. No organomegaly or masses felt. Normal bowel sounds heard. Central nervous system: Alert and oriented. No focal neurological deficits. Extremities: Left hip incision, dressing CDI Skin: No rashes, lesions or ulcers Psychiatry: Judgement and insight appear normal. Mood & affect appropriate.  Data Reviewed: I have personally reviewed following labs and imaging studies  CBC: Recent Labs  Lab 09/30/20 1742 10/01/20 0634 10/02/20 0524 10/05/20 0520  WBC 11.0* 13.1* 12.0* 12.1*  NEUTROABS 8.3*  --  8.8* 9.7*  HGB 12.2 11.2* 11.1* 9.4*  HCT 37.6 33.8* 33.0* 28.9*  MCV 94.2 93.1 93.5 93.5   PLT 211 194 177 199   Basic Metabolic Panel: Recent Labs  Lab 09/30/20 1742 10/01/20 0524 10/02/20 0524 10/05/20 0520 10/06/20 0830  NA 140 137 134* 131* 129*  K 3.6 4.2 4.2 4.5 4.7  CL 103 101 98 95* 93*  CO2 29 28 28 28 28   GLUCOSE 99 122* 114* 155* 113*  BUN 31* 28* 28* 45* 43*  CREATININE 1.36* 1.21* 1.35* 1.48* 1.53*  CALCIUM 9.0 9.0 9.0 8.3* 8.5*  MG  --  2.2  --   --   --   PHOS  --  3.0  --   --   --    GFR: Estimated Creatinine Clearance: 29.7 mL/min (A) (by C-G formula based on SCr of 1.53 mg/dL (H)). Liver Function Tests: Recent Labs  Lab 09/30/20 1742  AST 26  ALT 17  ALKPHOS 69  BILITOT 0.8  PROT 7.5  ALBUMIN 3.9   No results for input(s): LIPASE, AMYLASE in the last 168 hours. No results for input(s): AMMONIA in the last 168 hours. Coagulation Profile: Recent Labs  Lab 09/30/20 1742  INR 1.2   Cardiac Enzymes: No results for input(s): CKTOTAL, CKMB, CKMBINDEX, TROPONINI in the last 168 hours. BNP (last 3 results) No results for input(s): PROBNP in the last 8760 hours. HbA1C: No results for input(s): HGBA1C in the last 72 hours. CBG: No results for input(s): GLUCAP in the last 168 hours. Lipid Profile: No results for input(s): CHOL, HDL, LDLCALC, TRIG, CHOLHDL, LDLDIRECT in the last 72 hours. Thyroid Function Tests: No results for input(s): TSH, T4TOTAL, FREET4, T3FREE, THYROIDAB in the last 72 hours. Anemia Panel: No results for input(s): VITAMINB12, FOLATE, FERRITIN, TIBC, IRON, RETICCTPCT in the last 72 hours. Sepsis Labs: No results for input(s): PROCALCITON, LATICACIDVEN in the last 168 hours.  Recent Results (from the past 240 hour(s))  SARS CORONAVIRUS 2 (TAT 6-24 HRS) Nasopharyngeal Nasopharyngeal Swab     Status: None   Collection Time: 10/01/20  1:32 AM   Specimen: Nasopharyngeal Swab  Result Value Ref Range Status   SARS Coronavirus 2 NEGATIVE NEGATIVE Final    Comment: (NOTE) SARS-CoV-2 target nucleic acids are NOT  DETECTED.  The SARS-CoV-2 RNA is generally detectable in upper and lower respiratory specimens during the acute phase of infection. Negative results do not preclude SARS-CoV-2 infection, do not rule out co-infections with other pathogens, and should not be used as the sole basis for treatment or other patient management decisions. Negative results must be combined with clinical observations, patient history, and epidemiological information. The expected result is Negative.  Fact Sheet for Patients: 10/03/20  Fact Sheet for Healthcare Providers: HairSlick.no  This test is not yet approved or cleared by the [employer]ir[employer] FDA and  has been authorized for detection and/or diagnosis of SARS-CoV-2 by FDA under an Emergency Use Authorization (EUA). This EUA will remain  in effect (meaning this test can be used) for the duration of the COVID-19 declaration under Se ction 564(b)(1) of the Act, 21 U.S.C. section 360bbb-3(b)(1), unless the authorization is terminated or revoked sooner.  Performed at [facility], [address], [place], [place]  Radiology Studies: DG HIP OPERATIVE UNILAT W OR W/O PELVIS LEFT  Result Date: 10/04/2020 CLINICAL DATA:  LEFT total hip replacement. EXAM: OPERATIVE LEFT HIP (WITH PELVIS IF PERFORMED) 1 VIEWS TECHNIQUE: Fluoroscopic spot image(s) were submitted for interpretation post-operatively. COMPARISON:  Preop evaluation September 30, 2020 FINDINGS: Single intraoperative image is displayed showing signs of LEFT total hip replacement, acetabulum is incompletely visualized along the superior margin of the acetabular component. No signs of immediate complication on very limited AP view. Very limited imaging of the adjacent pelvis is provided. Tip of the femoral component is not imaged. FLUOROSCOPY TIME:  7 seconds. IMPRESSION: Intraoperative evaluation of LEFT hip arthroplasty  with no apparent complication. LEFT femoral component is incompletely imaged. Acetabular component also incompletely assessed with respect to the adjacent acetabulum along its superior margin. Attention on subsequent imaging is suggested. Electronically Signed   By: Donzetta Kohut M.D.   On: 10/04/2020 19:26        Scheduled Meds: . apixaban  5 mg Oral BID  . Chlorhexidine Gluconate Cloth  6 each Topical Daily  . docusate sodium  100 mg Oral BID  . levothyroxine  50 mcg Oral Daily  . magnesium oxide  400 mg Oral Daily  . metoprolol succinate  50 mg Oral Daily  . multivitamin with minerals  1 tablet Oral Daily  . potassium chloride  10 mEq Oral Daily  . senna  1 tablet Oral QHS  . sertraline  50 mg Oral Daily  . torsemide  20 mg Oral Daily   Continuous Infusions: . sodium chloride 10 mL/hr at 10/05/20 0455     LOS: 6 days    Time spent: 25 minutes    Tresa Moore, MD [facility] Pager [phone]  If 7PM-7AM, please contact night-coverage 10/06/2020, 3:37 PM

## 2020-10-06 NOTE — Progress Notes (Addendum)
Physical Therapy Treatment Patient Details Name: Heather [name] MRN: [id number] DOB: August 21, 1931 Today's Date: 10/06/2020    History of Present Illness Patient is an 87 year old female with medical history significant for atrial fibrillation on Eliquis, hypertension, hypothyroidism, hyperlipidemia, anxiety, depression, who presented to the hospital because of severe left hip pain following a mechanical fall at home. Found to have displaced Subcapital fracture left hip s/p left anterior hip hemiarthroplasty.    PT Comments    Pt received on [facility] with family present. Agreeable to PT with assistance to get back to the bed. Pt requiring Max VC/TC for safe hand placement and MaxA+2 for STS from [facility] with RW use. Stand pivot/step transfer with max cuing required along with therapist assist on RW for safe sequencing with turning RW. Increased time to complete. Decreased eccentric control for stand to sit. MaxA+2 for LE and shoulder management to return to supine. Performed supine LE exercises once in bed Hip abduction on LLE; no hip adduction performed past neutral. Pillows placed between pt's LE's to prevent hip adduction. At this time current follow-up recommendations remain appropriate.     Follow Up Recommendations  SNF     Equipment Recommendations  Other (comment) (defer to next level of care)    Recommendations for Other Services       Precautions / Restrictions Precautions Precautions: Fall Precaution Booklet Issued: Yes (comment) Restrictions Weight Bearing Restrictions: Yes LLE Weight Bearing: Weight bearing as tolerated    Mobility  Bed Mobility Overal bed mobility: Needs Assistance Bed Mobility:  (Found seated on BSC)     Supine to sit: Max assist Sit to supine: Max assist   General bed mobility comments: MaxA+2 for shoulder and LE managment    Transfers Overall transfer level: Needs assistance Equipment used: Rolling walker (2 wheeled) Transfers: Sit to/from  [employer] Sit to Stand: Max assist;+2 physical assistance Stand pivot transfers: Mod assist;+2 safety/equipment       General transfer comment: Required RW navigation, but did not requie blocking L knee.  Ambulation/Gait Ambulation/Gait assistance: Max assist;+2 physical assistance Gait Distance (Feet): 2 Feet Assistive device: Rolling walker (2 wheeled) Gait Pattern/deviations: Step-to pattern Gait velocity: decreased   General Gait Details: Pain limiting mobility. Step transfer from [facility] to EOB   Stairs             Wheelchair Mobility    Modified Rankin (Stroke Patients Only)       Balance Overall balance assessment: Needs assistance Sitting-balance support: Feet supported;Bilateral upper extremity supported Sitting balance-Leahy Scale: Fair Sitting balance - Comments: ABle to sit EOB with UE's in lap   Standing balance support: Bilateral upper extremity supported Standing balance-Leahy Scale: Poor Standing balance comment: MaxA+2 for standing balance with RW support                            Cognition Arousal/Alertness: Awake/alert Behavior During Therapy: WFL for tasks assessed/performed Overall Cognitive Status: Within Functional Limits for tasks assessed                                 General Comments: Pt is A but disoriented      Exercises General Exercises - Lower Extremity Ankle Circles/Pumps: AROM;Strengthening;Both;20 reps;Supine Quad Sets: AROM;Strengthening;Left;15 reps;Supine Hip ABduction/ADduction: AAROM;Left;10 reps    General Comments        Pertinent Vitals/Pain Pain Assessment: 0-10 Pain Score:  5  Pain Location: left hip Pain Descriptors / Indicators: Discomfort Pain Intervention(s): Limited activity within patient's tolerance;Monitored during session;Repositioned;Patient requesting pain meds-RN notified    Home Living                      Prior Function            PT  Goals (current goals can now be found in the care plan section) Acute Rehab PT Goals Patient Stated Goal: none stated PT Goal Formulation: With patient/family Time For Goal Achievement: 10/19/20 Potential to Achieve Goals: Fair Progress towards PT goals: Progressing toward goals    Frequency    BID      PT Plan Current plan remains appropriate    Co-evaluation              AM-PAC PT "6 Clicks" Mobility   Outcome Measure  Help needed turning from your back to your side while in a flat bed without using bedrails?: A Lot Help needed moving from lying on your back to sitting on the side of a flat bed without using bedrails?: A Lot Help needed moving to and from a bed to a chair (including a wheelchair)?: A Lot Help needed standing up from a chair using your arms (e.g., wheelchair or bedside chair)?: A Lot Help needed to walk in hospital room?: A Lot Help needed climbing 3-5 steps with a railing? : Total 6 Click Score: 11    End of Session Equipment Utilized During Treatment: Gait belt Activity Tolerance: Patient limited by pain Patient left: in bed;with call bell/phone within reach;with bed alarm set;with family/visitor present Nurse Communication: Mobility status;Patient requests pain meds PT Visit Diagnosis: Unsteadiness on feet (R26.81);Muscle weakness (generalized) (M62.81);Other abnormalities of gait and mobility (R26.89);Pain Pain - Right/Left: Left Pain - part of body: Hip     Time: 4098-1191 PT Time Calculation (min) (ACUTE ONLY): 24 min  Charges:  $Therapeutic Exercise: 23-37 mins $Therapeutic Activity: 23-37 mins           Milton M. Fairly IV, PT, DPT Physical Therapist- [place]  [facility]           10/06/2020, 3:51 PM

## 2020-10-06 NOTE — Progress Notes (Signed)
  Subjective:  Patient reports pain as mild.    Objective:   VITALS:   Vitals:   10/05/20 2007 10/05/20 2009 10/05/20 2342 10/06/20 0513  BP: (!) 102/50  (!) 104/53 (!) 123/53  Pulse: 70 72 70 74  Resp: 18  18 18   Temp: 98.9 F (37.2 C)  98.4 F (36.9 C) 98.3 F (36.8 C)  TempSrc:      SpO2:  93% (!) 88% 92%  Weight:      Height:        PHYSICAL EXAM:  Neurologically intact ABD soft Neurovascular intact Sensation intact distally Intact pulses distally Dorsiflexion/Plantar flexion intact Incision: dressing C/D/I No cellulitis present Compartment soft  LABS  No results found for this or any previous visit (from the past 24 hour(s)).  DG HIP OPERATIVE UNILAT W OR W/O PELVIS LEFT  Result Date: 10/04/2020 CLINICAL DATA:  LEFT total hip replacement. EXAM: OPERATIVE LEFT HIP (WITH PELVIS IF PERFORMED) 1 VIEWS TECHNIQUE: Fluoroscopic spot image(s) were submitted for interpretation post-operatively. COMPARISON:  Preop evaluation September 30, 2020 FINDINGS: Single intraoperative image is displayed showing signs of LEFT total hip replacement, acetabulum is incompletely visualized along the superior margin of the acetabular component. No signs of immediate complication on very limited AP view. Very limited imaging of the adjacent pelvis is provided. Tip of the femoral component is not imaged. FLUOROSCOPY TIME:  7 seconds. IMPRESSION: Intraoperative evaluation of LEFT hip arthroplasty with no apparent complication. LEFT femoral component is incompletely imaged. Acetabular component also incompletely assessed with respect to the adjacent acetabulum along its superior margin. Attention on subsequent imaging is suggested. Electronically Signed   By: October 02, 2020 M.D.   On: 10/04/2020 19:26    Assessment/Plan: 2 Days Post-Op   Principal Problem:   Fracture of femoral neck, left (HCC) Active Problems:   Heart failure with reduced ejection fraction (HCC)   Permanent atrial fibrillation  (HCC)   Advance diet Up with therapy Discharge to SNF OK from ortho standpoint Restarted Eliquis today RTC 2 weeks for staples call for appointment  [phone] April 4th WBAT left lower extremity Hemoglobin stable at 9.4  05-13-2004 , PA-C 10/06/2020, 6:55 AM

## 2020-10-07 ENCOUNTER — Inpatient Hospital Stay: Payer: Medicare Other

## 2020-10-07 LAB — CBC WITH DIFFERENTIAL/PLATELET
Abs Immature Granulocytes: 0.11 K/uL — ABNORMAL HIGH (ref 0.00–0.07)
Basophils Absolute: 0 K/uL (ref 0.0–0.1)
Basophils Relative: 0 %
Eosinophils Absolute: 0.4 K/uL (ref 0.0–0.5)
Eosinophils Relative: 3 %
HCT: 26.9 % — ABNORMAL LOW (ref 36.0–46.0)
Hemoglobin: 9 g/dL — ABNORMAL LOW (ref 12.0–15.0)
Immature Granulocytes: 1 %
Lymphocytes Relative: 11 %
Lymphs Abs: 1.3 K/uL (ref 0.7–4.0)
MCH: 30.8 pg (ref 26.0–34.0)
MCHC: 33.5 g/dL (ref 30.0–36.0)
MCV: 92.1 fL (ref 80.0–100.0)
Monocytes Absolute: 0.8 K/uL (ref 0.1–1.0)
Monocytes Relative: 7 %
Neutro Abs: 9.1 K/uL — ABNORMAL HIGH (ref 1.7–7.7)
Neutrophils Relative %: 78 %
Platelets: 253 K/uL (ref 150–400)
RBC: 2.92 MIL/uL — ABNORMAL LOW (ref 3.87–5.11)
RDW: 12.4 % (ref 11.5–15.5)
WBC: 11.6 K/uL — ABNORMAL HIGH (ref 4.0–10.5)
nRBC: 0 % (ref 0.0–0.2)

## 2020-10-07 LAB — BASIC METABOLIC PANEL
Anion gap: 9 (ref 5–15)
BUN: 44 mg/dL — ABNORMAL HIGH (ref 8–23)
CO2: 27 mmol/L (ref 22–32)
Calcium: 8.4 mg/dL — ABNORMAL LOW (ref 8.9–10.3)
Chloride: 94 mmol/L — ABNORMAL LOW (ref 98–111)
Creatinine, Ser: 1.41 mg/dL — ABNORMAL HIGH (ref 0.44–1.00)
GFR, Estimated: 36 mL/min — ABNORMAL LOW (ref >60)
Glucose, Bld: 104 mg/dL — ABNORMAL HIGH (ref 70–99)
Potassium: 4.6 mmol/L (ref 3.5–5.1)
Sodium: 130 mmol/L — ABNORMAL LOW (ref 135–145)

## 2020-10-07 LAB — RESP PANEL BY RT-PCR (FLU A&B, COVID) ARPGX2
Influenza A by PCR: NEGATIVE
Influenza B by PCR: NEGATIVE
SARS Coronavirus 2 by RT PCR: NEGATIVE

## 2020-10-07 MED ORDER — BISACODYL 10 MG RE SUPP
10.0000 mg | Freq: Every day | RECTAL | Status: DC | PRN
  Administered 2020-10-07: 10 mg via RECTAL
  Filled 2020-10-07: qty 1

## 2020-10-07 MED ORDER — SENNOSIDES-DOCUSATE SODIUM 8.6-50 MG PO TABS
1.0000 | ORAL_TABLET | Freq: Two times a day (BID) | ORAL | Status: DC
  Administered 2020-10-07 – 2020-10-08 (×2): 1 via ORAL
  Filled 2020-10-07 (×3): qty 1

## 2020-10-07 MED ORDER — MAGNESIUM HYDROXIDE 400 MG/5ML PO SUSP
30.0000 mL | Freq: Every day | ORAL | Status: DC | PRN
  Administered 2020-10-07: 30 mL via ORAL
  Filled 2020-10-07: qty 30

## 2020-10-07 MED ORDER — POLYETHYLENE GLYCOL 3350 17 G PO PACK
17.0000 g | PACK | Freq: Every day | ORAL | Status: DC
  Administered 2020-10-07 – 2020-10-08 (×2): 17 g via ORAL
  Filled 2020-10-07 (×2): qty 1

## 2020-10-07 NOTE — Progress Notes (Signed)
PROGRESS NOTE    Heather [name]  SAY:[id number] DOB: 1932-05-14 DOA: 09/30/2020 PCP: Barbette Reichmann, MD  Brief Narrative:  86 y.o. female is an 87 year old woman with medical history significant for atrial fibrillation on Eliquis, hypertension, hypothyroidism, hyperlipidemia, anxiety, depression, chronic systolic CHF, who presented to the hospital because of severe left hip pain following a mechanical fall at home.  She was found to have closed left hip fracture.  She was taking Eliquis prior to admission so this was held prior to surgery.  She was seen by orthopedic surgeon.  She underwent left hip hemiarthroplasty on 10/04/2020.  PT and OT recommend discharge to SNF.  Eliquis restarted postoperatively.  Currently pending placement to skilled nursing facility.  TOC aware   Assessment & Plan:   Principal Problem:   Fracture of femoral neck, left (HCC) Active Problems:   Heart failure with reduced ejection fraction (HCC)   Permanent atrial fibrillation (HCC)  Closed left hip fracture  s/p left hip hemiarthroplasty on 10/04/2020.  Pain well controlled Eliquis restarted Plan: Multimodal pain control.  Up with therapy.  Bed exercises as able.  Bowel regimen.  TOC aware and bed search initiated.  Constipation Unclear when last bowel movement was Patient states it has been a week Plan: Multimodal bowel regimen Check KUB rule out ileus/obstruction  Acute urinary retention  Improved.  Foley catheter has been removed.  Acute kidney injury Suspect dehydration Encourage p.o. fluid intake Creatinine improving Recheck creatinine in a.m.  Acute on chronic postoperative blood loss anemia Hemoglobin 9.4 as of 3/22 No clear source of blood loss No oxygen demand No indication for transfusion currently Okay for resume Eliquis  Permanent atrial fibrillation  Continue metoprolol.   Eliquis restarted 3/23  Chronic systolic CHF  EF 40 to 45% December 2017 Continue  torsemide Monitor kidney function   DVT prophylaxis: Eliquis Code Status: Full Family Communication: Family member at bedside 3/24 Disposition Plan: Status is: Inpatient  Remains inpatient appropriate because:Inpatient level of care appropriate due to severity of illness   Dispo:  Patient From: Home  Planned Disposition: Skilled Nursing Facility  Medically stable for discharge: No     Bed search initiated.  Once patient has successful BM she be medically stable for discharge.  Anticipate 24 hours.    Level of care: Med-Surg  Consultants:   Orthopedics  Procedures:   Hip fracture repair  Antimicrobials:   None   Subjective: Patient seen and examined.  Frustrated about continued hospital stay.  Pain endorses mild.  Objective: Vitals:   10/06/20 1604 10/06/20 2047 10/07/20 0458 10/07/20 0919  BP: (!) 123/58 (!) 130/54 (!) 147/67 140/66  Pulse: 69 70 70 71  Resp: 18 16 16 18   Temp: 98.1 F (36.7 C) 98.5 F (36.9 C) 99.1 F (37.3 C) 98.6 F (37 C)  TempSrc: Oral Oral Oral   SpO2: 91% 95% 93% 94%  Weight:      Height:        Intake/Output Summary (Last 24 hours) at 10/07/2020 1104 Last data filed at 10/06/2020 1442 Gross per 24 hour  Intake 240 ml  Output 400 ml  Net -160 ml   Filed Weights   10/02/20 0300 10/04/20 0532 10/05/20 0458  Weight: 93.4 kg 94 kg 92.7 kg    Examination:  General exam: Appears calm and comfortable  Respiratory system: Clear to auscultation. Respiratory effort normal. Cardiovascular system: S1 & S2 heard, RRR. No JVD, murmurs, rubs, gallops or clicks. No pedal edema. Gastrointestinal system: Abdomen is  nondistended, soft and nontender. No organomegaly or masses felt. Normal bowel sounds heard. Central nervous system: Alert and oriented. No focal neurological deficits. Extremities: Left hip incision, dressing CDI Skin: No rashes, lesions or ulcers Psychiatry: Judgement and insight appear normal. Mood & affect appropriate.      Data Reviewed: I have personally reviewed following labs and imaging studies  CBC: Recent Labs  Lab 09/30/20 1742 10/01/20 0634 10/02/20 0524 10/05/20 0520 10/07/20 0737  WBC 11.0* 13.1* 12.0* 12.1* 11.6*  NEUTROABS 8.3*  --  8.8* 9.7* 9.1*  HGB 12.2 11.2* 11.1* 9.4* 9.0*  HCT 37.6 33.8* 33.0* 28.9* 26.9*  MCV 94.2 93.1 93.5 93.5 92.1  PLT 211 194 177 199 253   Basic Metabolic Panel: Recent Labs  Lab 10/01/20 0524 10/02/20 0524 10/05/20 0520 10/06/20 0830 10/07/20 0737  NA 137 134* 131* 129* 130*  K 4.2 4.2 4.5 4.7 4.6  CL 101 98 95* 93* 94*  CO2 28 28 28 28 27   GLUCOSE 122* 114* 155* 113* 104*  BUN 28* 28* 45* 43* 44*  CREATININE 1.21* 1.35* 1.48* 1.53* 1.41*  CALCIUM 9.0 9.0 8.3* 8.5* 8.4*  MG 2.2  --   --   --   --   PHOS 3.0  --   --   --   --    GFR: Estimated Creatinine Clearance: 32.2 mL/min (A) (by C-G formula based on SCr of 1.41 mg/dL (H)). Liver Function Tests: Recent Labs  Lab 09/30/20 1742  AST 26  ALT 17  ALKPHOS 69  BILITOT 0.8  PROT 7.5  ALBUMIN 3.9   No results for input(s): LIPASE, AMYLASE in the last 168 hours. No results for input(s): AMMONIA in the last 168 hours. Coagulation Profile: Recent Labs  Lab 09/30/20 1742  INR 1.2   Cardiac Enzymes: No results for input(s): CKTOTAL, CKMB, CKMBINDEX, TROPONINI in the last 168 hours. BNP (last 3 results) No results for input(s): PROBNP in the last 8760 hours. HbA1C: No results for input(s): HGBA1C in the last 72 hours. CBG: No results for input(s): GLUCAP in the last 168 hours. Lipid Profile: No results for input(s): CHOL, HDL, LDLCALC, TRIG, CHOLHDL, LDLDIRECT in the last 72 hours. Thyroid Function Tests: No results for input(s): TSH, T4TOTAL, FREET4, T3FREE, THYROIDAB in the last 72 hours. Anemia Panel: No results for input(s): VITAMINB12, FOLATE, FERRITIN, TIBC, IRON, RETICCTPCT in the last 72 hours. Sepsis Labs: No results for input(s): PROCALCITON, LATICACIDVEN in the  last 168 hours.  Recent Results (from the past 240 hour(s))  SARS CORONAVIRUS 2 (TAT 6-24 HRS) Nasopharyngeal Nasopharyngeal Swab     Status: None   Collection Time: 10/01/20  1:32 AM   Specimen: Nasopharyngeal Swab  Result Value Ref Range Status   SARS Coronavirus 2 NEGATIVE NEGATIVE Final    Comment: (NOTE) SARS-CoV-2 target nucleic acids are NOT DETECTED.  The SARS-CoV-2 RNA is generally detectable in upper and lower respiratory specimens during the acute phase of infection. Negative results do not preclude SARS-CoV-2 infection, do not rule out co-infections with other pathogens, and should not be used as the sole basis for treatment or other patient management decisions. Negative results must be combined with clinical observations, patient history, and epidemiological information. The expected result is Negative.  Fact Sheet for Patients: 10/03/20  Fact Sheet for Healthcare Providers: HairSlick.no  This test is not yet approved or cleared by the quierodirigir.com FDA and  has been authorized for detection and/or diagnosis of SARS-CoV-2 by FDA under an Emergency Use Authorization (  EUA). This EUA will remain  in effect (meaning this test can be used) for the duration of the COVID-19 declaration under Se ction 564(b)(1) of the Act, 21 U.S.C. section 360bbb-3(b)(1), unless the authorization is terminated or revoked sooner.  Performed at [facility], [address], [place], [place] [address]          Radiology Studies: DG Abd 1 View  Result Date: 10/07/2020 CLINICAL DATA:  Constipation. EXAM: ABDOMEN - 1 VIEW COMPARISON:  None. FINDINGS: The bowel gas pattern is normal. Moderate amount of stool seen throughout the colon. No radio-opaque calculi or other significant radiographic abnormality are seen. IMPRESSION: Moderate stool burden. No evidence of bowel obstruction or ileus. Electronically Signed   By:  Lupita Raider M.D.   On: 10/07/2020 08:56        Scheduled Meds: . apixaban  5 mg Oral BID  . Chlorhexidine Gluconate Cloth  6 each Topical Daily  . levothyroxine  50 mcg Oral Daily  . metoprolol succinate  50 mg Oral Daily  . multivitamin with minerals  1 tablet Oral Daily  . polyethylene glycol  17 g Oral Daily  . potassium chloride  10 mEq Oral Daily  . senna-docusate  1 tablet Oral BID  . sertraline  50 mg Oral Daily  . torsemide  20 mg Oral Daily   Continuous Infusions: . sodium chloride 10 mL/hr at 10/05/20 0455     LOS: 7 days    Time spent: 15 minutes    Tresa Moore, MD [facility] Pager [phone]  If 7PM-7AM, please contact night-coverage 10/07/2020, 11:04 AM

## 2020-10-07 NOTE — TOC Progression Note (Addendum)
Transition of Care [facility]) - Progression Note    Patient Details  Name: Heather [name] MRN: [id number] Date of Birth: [birth year]  [facility] CM/SW Contact  Caryn Section, RN Phone Number: 10/07/2020, 12:56 PM  Clinical Narrative:  Update: Authorization received from [place]: [id number].  Per Verlon Au at [facility], patient can go tomorrow.  TOC in to see patient and son at bedside re:  SNF facility selection.  Patient agrees to discuss with son, and daughter on the phone during this discussion.  Patient and family have decided on [facility].  Verlon Au at facility made aware, they will accept patient.  TOC explained that once insurance authorization is obtained, patient can move to facility.  No other questions at this time from patient or family, TOC contact information given, will follow through discharge.    Expected Discharge Plan: Skilled Nursing Facility Barriers to Discharge: Awaiting State Approval (PASRR),Continued Medical Work up  Expected Discharge Plan and Services Expected Discharge Plan: Skilled Nursing Facility     Post Acute Care Choice: Skilled Nursing Facility Living arrangements for the past 2 months: Single Family Home                                       Social Determinants of Health (SDOH) Interventions    Readmission Risk Interventions No flowsheet data found.

## 2020-10-07 NOTE — Progress Notes (Signed)
PT Cancellation Note  Patient Details Name: Heather [name] MRN: [id number] DOB: 16-Feb-1932   Cancelled Treatment:    Reason Eval/Treat Not Completed: Other (comment). Pt in bed, sleeping but wakes to PT. Pt and CNA stated patient recently returned to bed (~20-68minutes) ago, and suppository administered. Pt requesting to rest. PT to re-attempt as able.   Olga Coaster PT, DPT 1:28 PM,10/07/20

## 2020-10-07 NOTE — Progress Notes (Signed)
Physical Therapy Treatment Patient Details Name: Heather [name] MRN: [id number] DOB: 1932-04-22 Today's Date: 10/07/2020    History of Present Illness Patient is an 87 year old female with medical history significant for atrial fibrillation on Eliquis, hypertension, hypothyroidism, hyperlipidemia, anxiety, depression, who presented to the hospital because of severe left hip pain following a mechanical fall at home. Found to have displaced Subcapital fracture left hip s/p left anterior hip hemiarthroplasty.    PT Comments    Pt alert, oriented to self, place, time. Demonstrated mild pain signs/symptoms with mobility attempts this PM. The patient demonstrated improved mobility; able to sit <> stand twice during session, second attempt with minA and stood and pivoted to [facility] with 2+ for safety, minA. Pt also performed a few seated exercises at EOB prior to transfer to commode with tactile cueing. Pt's standing tolerance also improved, able to stand and march in place, lifting each foot twice. Up on commode with call bell and family at bedside, CNA aware of pt status. The patient would benefit from further skilled PT intervention to continue to progress towards goals. Recommendation remains appropriate.       Follow Up Recommendations  SNF     Equipment Recommendations  Other (comment) (defer to next level of care)    Recommendations for Other Services       Precautions / Restrictions Precautions Precautions: Fall Precaution Booklet Issued: No Restrictions Weight Bearing Restrictions: Yes LLE Weight Bearing: Weight bearing as tolerated    Mobility  Bed Mobility               General bed mobility comments: pt seated on EOB upon PT entrance    Transfers Overall transfer level: Needs assistance Equipment used: Rolling walker (2 wheeled) Transfers: Sit to/from [employer] Sit to Stand: Mod assist;Min assist Stand pivot transfers: Min assist       General  transfer comment: sit <> stand from EOB twice, second stand pt requested to use BSC and stand pivot with minA.  Ambulation/Gait                 Stairs             Wheelchair Mobility    Modified Rankin (Stroke Patients Only)       Balance Overall balance assessment: Needs assistance Sitting-balance support: Feet supported Sitting balance-Leahy Scale: Good Sitting balance - Comments: ABle to sit EOB with UE's in lap   Standing balance support: Bilateral upper extremity supported Standing balance-Leahy Scale: Poor                              Cognition Arousal/Alertness: Awake/alert Behavior During Therapy: WFL for tasks assessed/performed Overall Cognitive Status: Within Functional Limits for tasks assessed                                 General Comments: alert, oriented to self, place, time Corporate treasurer)      Exercises General Exercises - Lower Extremity Long Arc Quad: Strengthening;AROM;Both;15 reps Heel Raises: AROM;Strengthening;Both;15 reps    General Comments        Pertinent Vitals/Pain Pain Assessment: Faces Faces Pain Scale: Hurts a little bit Pain Location: left hip with standing Pain Intervention(s): Limited activity within patient's tolerance;Monitored during session;Repositioned    Home Living  Prior Function            PT Goals (current goals can now be found in the care plan section) Progress towards PT goals: Progressing toward goals    Frequency    BID      PT Plan Current plan remains appropriate    Co-evaluation              AM-PAC PT "6 Clicks" Mobility   Outcome Measure  Help needed turning from your back to your side while in a flat bed without using bedrails?: A Lot Help needed moving from lying on your back to sitting on the side of a flat bed without using bedrails?: A Lot Help needed moving to and from a bed to a chair (including a wheelchair)?:  A Lot Help needed standing up from a chair using your arms (e.g., wheelchair or bedside chair)?: A Lot Help needed to walk in hospital room?: A Lot Help needed climbing 3-5 steps with a railing? : Total 6 Click Score: 11    End of Session Equipment Utilized During Treatment: Gait belt Activity Tolerance: Patient tolerated treatment well;Patient limited by fatigue Patient left: in bed;with call bell/phone within reach;with bed alarm set;with family/visitor present Nurse Communication: Mobility status;Patient requests pain meds PT Visit Diagnosis: Unsteadiness on feet (R26.81);Muscle weakness (generalized) (M62.81);Other abnormalities of gait and mobility (R26.89);Pain Pain - Right/Left: Left Pain - part of body: Hip     Time: 3734-2876 PT Time Calculation (min) (ACUTE ONLY): 30 min  Charges:  $Therapeutic Exercise: 23-37 mins                     Olga Coaster PT, DPT 3:16 PM,10/07/20

## 2020-10-07 NOTE — Progress Notes (Signed)
Physical Therapy Treatment Patient Details Name: Heather [name] MRN: [id number] DOB: 25-Feb-1932 Today's Date: 10/07/2020    History of Present Illness Patient is an 87 year old female with medical history significant for atrial fibrillation on Eliquis, hypertension, hypothyroidism, hyperlipidemia, anxiety, depression, who presented to the hospital because of severe left hip pain following a mechanical fall at home. Found to have displaced Subcapital fracture left hip s/p left anterior hip hemiarthroplasty.    PT Comments    Pt alert, sitting EOB with family in room. Oriented to year, able to follow all commands, often needed mod encouragement to maximize participation and boost confidence. Sit <> stand with maxA and RW, pt able to stand and pivot to recliner in room with modA. Improved navigation of RW noted as well as improve L leg stability. Step by step cueing needed for sequencing of task. Pt needed cueing for hand placement for each transfer as well to maximize safety. Upon sitting and resting, pt able to participate in several seated exercises, with visual and tactile cueing. Up in chair with breakfast set up. The patient would benefit from further skilled PT intervention to continue to progress towards goals. Recommendation remains appropriate.       Follow Up Recommendations  SNF     Equipment Recommendations  Other (comment) (defer to next level of care)    Recommendations for Other Services       Precautions / Restrictions Precautions Precautions: Fall Precaution Booklet Issued: No Restrictions Weight Bearing Restrictions: Yes LLE Weight Bearing: Weight bearing as tolerated    Mobility  Bed Mobility Overal bed mobility: Needs Assistance             General bed mobility comments: pt seated on EOB upon PT entrance    Transfers Overall transfer level: Needs assistance Equipment used: Rolling walker (2 wheeled) Transfers: Sit to/from [employer] Sit to Stand: Max assist Stand pivot transfers: Mod assist       General transfer comment: pt with improved weight  bearing, little to no knee blocking, improved ability to navigate RW. fatigued, flexed trunk throughout  Ambulation/Gait                 Stairs             Wheelchair Mobility    Modified Rankin (Stroke Patients Only)       Balance Overall balance assessment: Needs assistance Sitting-balance support: Feet supported Sitting balance-Leahy Scale: Good Sitting balance - Comments: ABle to sit EOB with UE's in lap   Standing balance support: Bilateral upper extremity supported Standing balance-Leahy Scale: Poor Standing balance comment: reliant on RW for support                            Cognition Arousal/Alertness: Awake/alert Behavior During Therapy: WFL for tasks assessed/performed Overall Cognitive Status: Within Functional Limits for tasks assessed                                 General Comments: Pt is A but disoriented      Exercises General Exercises - Lower Extremity Long Arc Quad: Strengthening;AROM;Both;15 reps Hip Flexion/Marching: AROM;Strengthening;Both;15 reps Toe Raises: AROM;Strengthening;Both;15 reps Heel Raises: AROM;Strengthening;Both;15 reps    General Comments        Pertinent Vitals/Pain Pain Assessment: Faces Faces Pain Scale: Hurts little more Pain Location: left hip with exercises and mobility Pain  Descriptors / Indicators: Discomfort;Grimacing;Guarding;Moaning Pain Intervention(s): Limited activity within patient's tolerance;Monitored during session;Repositioned    Home Living                      Prior Function            PT Goals (current goals can now be found in the care plan section) Progress towards PT goals: Progressing toward goals    Frequency    BID      PT Plan Current plan remains appropriate    Co-evaluation              AM-PAC  PT "6 Clicks" Mobility   Outcome Measure  Help needed turning from your back to your side while in a flat bed without using bedrails?: A Lot Help needed moving from lying on your back to sitting on the side of a flat bed without using bedrails?: A Lot Help needed moving to and from a bed to a chair (including a wheelchair)?: A Lot Help needed standing up from a chair using your arms (e.g., wheelchair or bedside chair)?: A Lot Help needed to walk in hospital room?: A Lot Help needed climbing 3-5 steps with a railing? : Total 6 Click Score: 11    End of Session Equipment Utilized During Treatment: Gait belt Activity Tolerance: Patient tolerated treatment well;Patient limited by fatigue Patient left: in bed;with call bell/phone within reach;with bed alarm set;with family/visitor present Nurse Communication: Mobility status;Patient requests pain meds PT Visit Diagnosis: Unsteadiness on feet (R26.81);Muscle weakness (generalized) (M62.81);Other abnormalities of gait and mobility (R26.89);Pain Pain - Right/Left: Left Pain - part of body: Hip     Time: 3546-5681 PT Time Calculation (min) (ACUTE ONLY): 23 min  Charges:  $Therapeutic Exercise: 23-37 mins                     Olga Coaster PT, DPT 10:54 AM,10/07/20

## 2020-10-07 NOTE — Progress Notes (Signed)
  Subjective:  Patient reports pain as mild.    Objective:   VITALS:   Vitals:   10/06/20 1122 10/06/20 1604 10/06/20 2047 10/07/20 0458  BP: (!) 136/57 (!) 123/58 (!) 130/54 (!) 147/67  Pulse: 70 69 70 70  Resp: 15 18 16 16   Temp: 99 F (37.2 C) 98.1 F (36.7 C) 98.5 F (36.9 C) 99.1 F (37.3 C)  TempSrc: Oral Oral Oral Oral  SpO2: 93% 91% 95% 93%  Weight:      Height:        PHYSICAL EXAM:  Neurologically intact ABD soft Neurovascular intact Sensation intact distally Intact pulses distally Dorsiflexion/Plantar flexion intact Incision: scant drainage and aquacel changed today No cellulitis present Compartment soft  LABS  Results for orders placed or performed during the hospital encounter of 09/30/20 (from the past 24 hour(s))  Basic metabolic panel     Status: Abnormal   Collection Time: 10/06/20  8:30 AM  Result Value Ref Range   Sodium 129 (L) 135 - 145 mmol/L   Potassium 4.7 3.5 - 5.1 mmol/L   Chloride 93 (L) 98 - 111 mmol/L   CO2 28 22 - 32 mmol/L   Glucose, Bld 113 (H) 70 - 99 mg/dL   BUN 43 (H) 8 - 23 mg/dL   Creatinine, Ser 10/08/20 (H) 0.44 - 1.00 mg/dL   Calcium 8.5 (L) 8.9 - 10.3 mg/dL   GFR, Estimated 33 (L) >60 mL/min   Anion gap 8 5 - 15    No results found.  Assessment/Plan: 3 Days Post-Op   Principal Problem:   Fracture of femoral neck, left (HCC) Active Problems:   Heart failure with reduced ejection fraction (HCC)   Permanent atrial fibrillation (HCC)   Advance diet Up with therapy Discharge to [facility] from ortho standpoint, awaiting placement Restarted Eliquis today RTC 2 weeks for staples call for appointment  [phone] April 4th WBAT left lower extremity  05-13-2004 , PA-C 10/07/2020, 6:32 AM

## 2020-10-08 MED ORDER — OXYCODONE HCL 5 MG PO TABS: 5.0000 mg | ORAL_TABLET | Freq: Four times a day (QID) | ORAL | 0 refills | Status: AC | PRN

## 2020-10-08 MED ORDER — SENNOSIDES-DOCUSATE SODIUM 8.6-50 MG PO TABS: 1.0000 | ORAL_TABLET | Freq: Two times a day (BID) | ORAL | Status: DC

## 2020-10-08 NOTE — Progress Notes (Signed)
Physical Therapy Treatment Patient Details Name: Heather [name] MRN: [id number] DOB: [birth year] Today's Date: 10/08/2020    History of Present Illness Patient is an 87 year old female with medical history significant for atrial fibrillation on Eliquis, hypertension, hypothyroidism, hyperlipidemia, anxiety, depression, who presented to the hospital because of severe left hip pain following a mechanical fall at home. Found to have displaced Subcapital fracture left hip s/p left anterior hip hemiarthroplasty.    PT Comments    Patient alert, in bed, nursing staff at bedside to administer medications. Pt with [place], trialed on room air with RN consent, spO2 >90% throughout session. minA to sit EOB for morning meds, able to sit with good balance ~76minutes without UE support. Sit <> Stand from several surfaces, minA with RW. Pt able to tolerate ambulation ~110ft with RW and CGA, close chair follow for safety. No LOB noted, pt did report L hip pain with ambulation that resolved with rest. Reciprocal gait pattern with decreased step length bilaterally, cued for RW use. Returned to chair, set up for breakfast and all needs in reach at end of session. The patient would benefit from further skilled PT intervention to continue to progress towards goals. Recommendation remains appropriate.       Follow Up Recommendations  SNF     Equipment Recommendations  Other (comment) (defer to next level of care)    Recommendations for Other Services       Precautions / Restrictions Precautions Precautions: Fall Precaution Booklet Issued: No Restrictions Weight Bearing Restrictions: Yes LLE Weight Bearing: Weight bearing as tolerated    Mobility  Bed Mobility Overal bed mobility: Needs Assistance Bed Mobility: Supine to Sit     Supine to sit: Min assist;Min guard     General bed mobility comments: light mina initial for LLE assist but able to progress and perform with CGA    Transfers Overall  transfer level: Needs assistance Equipment used: Rolling walker (2 wheeled) Transfers: Sit to/from Stand Sit to Stand: Min assist         General transfer comment: sit <> stand from EOB and from [facility] and recliner. light minA for light or steadying  Ambulation/Gait Ambulation/Gait assistance: Min guard Gait Distance (Feet): 12 Feet Assistive device: Rolling walker (2 wheeled)   Gait velocity: decreased   General Gait Details: decreased step length bilaterally, reported pain with ea step of LLE   Stairs             Wheelchair Mobility    Modified Rankin (Stroke Patients Only)       Balance Overall balance assessment: Needs assistance Sitting-balance support: Feet supported Sitting balance-Leahy Scale: Good Sitting balance - Comments: Able to sit EOB with UE's in lap, sat for several minutes to take medicine, converse with nursing staff, ~10 minutes   Standing balance support: Bilateral upper extremity supported Standing balance-Leahy Scale: Poor Standing balance comment: reliant on RW for support                            Cognition Arousal/Alertness: Awake/alert Behavior During Therapy: WFL for tasks assessed/performed Overall Cognitive Status: Within Functional Limits for tasks assessed                                 General Comments: alert, oriented to self, place, time (month/year)      Exercises Other Exercises Other Exercises: Pt transferred to  BSC with minA, maxA for pericare    General Comments        Pertinent Vitals/Pain Pain Assessment: Faces Faces Pain Scale: Hurts little more Pain Location: left hip with ambulation Pain Descriptors / Indicators: Discomfort;Grimacing;Guarding;Moaning Pain Intervention(s): Limited activity within patient's tolerance;Monitored during session;Repositioned;RN gave pain meds during session    Home Living                      Prior Function            PT Goals (current  goals can now be found in the care plan section) Progress towards PT goals: Progressing toward goals    Frequency    BID      PT Plan Current plan remains appropriate    Co-evaluation              AM-PAC PT "6 Clicks" Mobility   Outcome Measure  Help needed turning from your back to your side while in a flat bed without using bedrails?: A Little Help needed moving from lying on your back to sitting on the side of a flat bed without using bedrails?: A Little Help needed moving to and from a bed to a chair (including a wheelchair)?: A Little Help needed standing up from a chair using your arms (e.g., wheelchair or bedside chair)?: A Little Help needed to walk in hospital room?: A Little Help needed climbing 3-5 steps with a railing? : Total 6 Click Score: 16    End of Session Equipment Utilized During Treatment: Gait belt Activity Tolerance: Patient tolerated treatment well;Patient limited by fatigue Patient left: in bed;with call bell/phone within reach;with bed alarm set;with family/visitor present Nurse Communication: Mobility status;Patient requests pain meds PT Visit Diagnosis: Unsteadiness on feet (R26.81);Muscle weakness (generalized) (M62.81);Other abnormalities of gait and mobility (R26.89);Pain Pain - Right/Left: Left Pain - part of body: Hip     Time: 3212-2482 PT Time Calculation (min) (ACUTE ONLY): 40 min  Charges:  $Gait Training: 8-22 mins $Therapeutic Exercise: 8-22 mins $Therapeutic Activity: 8-22 mins                     Olga Coaster PT, DPT 10:37 AM,10/08/20

## 2020-10-08 NOTE — Progress Notes (Signed)
Pt transported by EMS in stretcher, no IV present. All dressings c,d,i. Report was called. All belongings with pt

## 2020-10-08 NOTE — Discharge Summary (Signed)
Physician Discharge Summary  Heather [name] INO:[id number] DOB: Jan 25, 1932 DOA: 09/30/2020  PCP: Barbette Reichmann, MD  Admit date: 09/30/2020 Discharge date: 10/08/2020  Admitted From: Home Disposition: SNF  Recommendations for Outpatient Follow-up:  1. Follow up with PCP in 1-2 weeks 2. Follow-up with Dr. Odis Luster orthopedic surgery April 4  Home Health: No Equipment/Devices: Yes oxygen 1 L Discharge Condition: Stable CODE STATUS: Full Diet recommendation: Regular Brief/Interim Summary: 87 y.o.femaleis an 87 year old woman with medical history significant for atrial fibrillation on Eliquis, hypertension, hypothyroidism, hyperlipidemia, anxiety, depression,chronic systolic CHF,who presented to the hospital because of severe left hip pain following a mechanical fall at home.  She was found to have closed left hip fracture.She was taking Eliquis prior to admission so this was held prior to surgery. She was seen by orthopedic surgeon. She underwent left hip hemiarthroplasty on 10/04/2020. PT and OT recommend discharge to SNF.  Eliquis restarted postoperatively.  After clearance from discharge from orthopedic surgery bed search initiated.  Family and patient selected [employer].  Patient discharged in stable condition on 3/25.  Follow-up in orthopedic office April 4 for wound check and staple removal.  Discharge Diagnoses:  Principal Problem:   Fracture of femoral neck, left (HCC) Active Problems:   Heart failure with reduced ejection fraction (HCC)   Permanent atrial fibrillation (HCC)  Closed left hip fracture s/pleft hip hemiarthroplasty on 10/04/2020.  Pain well controlled Eliquis restarted postoperatively No bleeding noted Pain well controlled at time of discharge Stable for discharge to skilled nursing facility Follow-up in orthopedic office with Dr. Odis Luster April 4 for wound check and staple removal  Constipation Unclear when last bowel movement was Patient  states it has been a week Multimodal bowel regimen successful BM endorsed 3/24  Acute urinary retention Improved.Foley catheter has been removed. Urinating spontaneously  Acute kidney injury Suspect dehydration Encourage p.o. fluid intake Creatinine improving at time of discharge Avoid dehydration and nephrotoxins  Acute on chronic postoperative blood loss anemia Hemoglobin 9.4 as of 3/22 No clear source of blood loss No oxygen demand No indication for transfusion currently Hemoglobin stable after Eliquis resumed  Permanent atrial fibrillation Continue metoprolol.  Eliquis restarted 3/23  Chronic systolic CHF  EF 40 to 45% December 2017 Continue torsemide No evidence of exacerbation    Discharge Instructions  Discharge Instructions    Diet - low sodium heart healthy   Complete by: As directed    Increase activity slowly   Complete by: As directed    No wound care   Complete by: As directed      Allergies as of 10/08/2020      Reactions   Other Shortness Of Breath   Other reaction(s): Other (See Comments) Pollen (wheezing,SOB)   Codeine Other (See Comments)   Dizziness  - makes pt fell "drunk"   Sulfa Antibiotics Other (See Comments)   Pt prefers not to take med   Sulfamethoxazole-trimethoprim    Other reaction(s): Other (See Comments)      Medication List    STOP taking these medications   Magnesium Gluconate 550 MG Tabs   meclizine 12.5 MG tablet Commonly known as: ANTIVERT     TAKE these medications   apixaban 5 MG Tabs tablet Commonly known as: ELIQUIS Take by mouth.   levothyroxine 50 MCG tablet Commonly known as: SYNTHROID TAKE 1 TABLET BY MOUTH EVERY DAY   losartan 50 MG tablet Commonly known as: COZAAR Take 50 mg by mouth daily.   metoprolol succinate 50 MG 24 hr  tablet Commonly known as: TOPROL-XL Take 50 mg by mouth daily.   MULTIVITAMIN ADULT (MINERALS) PO Take 1 tablet by mouth daily.   oxyCODONE 5 MG immediate  release tablet Commonly known as: Oxy IR/ROXICODONE Take 1 tablet (5 mg total) by mouth every 6 (six) hours as needed for up to 2 days for moderate pain or breakthrough pain. For SNF use only   potassium chloride 10 MEQ tablet Commonly known as: KLOR-CON Take 10 mEq by mouth daily.   senna-docusate 8.6-50 MG tablet Commonly known as: Senokot-S Take 1 tablet by mouth 2 (two) times daily.   sertraline 50 MG tablet Commonly known as: ZOLOFT Take 50 mg by mouth at bedtime.   torsemide 20 MG tablet Commonly known as: DEMADEX Take 20 mg by mouth daily.       Contact information for follow-up providers    Lyndle Herrlich, MD. Schedule an appointment as soon as possible for a visit on 10/18/2020.   Specialty: Orthopedic Surgery Why: Follow up April 4th for wound check and staple removal Contact information: [address] [place] [address] [phone]            Contact information for after-discharge care    Destination    [facility] .   Service: Skilled Nursing Contact information: [address] [place] [address] [phone]                 Allergies  Allergen Reactions  . Other Shortness Of Breath    Other reaction(s): Other (See Comments) Pollen (wheezing,SOB)  . Codeine Other (See Comments)    Dizziness  - makes pt fell "drunk"  . Sulfa Antibiotics Other (See Comments)    Pt prefers not to take med  . Sulfamethoxazole-Trimethoprim     Other reaction(s): Other (See Comments)    Consultations:  Orthopedic surgery-[facility]   Procedures/Studies: DG Chest 1 View  Result Date: 09/30/2020 CLINICAL DATA:  Preop.  Left hip fracture. EXAM: CHEST  1 VIEW COMPARISON:  June 30, 2016. FINDINGS: Mild cardiomegaly is noted. Both lungs are clear. Left-sided pacemaker is noted in grossly good position. The visualized skeletal structures are unremarkable.  IMPRESSION: No active disease. Electronically Signed   By: Lupita Raider M.D.   On: 09/30/2020 18:44   DG Abd 1 View  Result Date: 10/07/2020 CLINICAL DATA:  Constipation. EXAM: ABDOMEN - 1 VIEW COMPARISON:  None. FINDINGS: The bowel gas pattern is normal. Moderate amount of stool seen throughout the colon. No radio-opaque calculi or other significant radiographic abnormality are seen. IMPRESSION: Moderate stool burden. No evidence of bowel obstruction or ileus. Electronically Signed   By: Lupita Raider M.D.   On: 10/07/2020 08:56   DG HIP OPERATIVE UNILAT W OR W/O PELVIS LEFT  Result Date: 10/04/2020 CLINICAL DATA:  LEFT total hip replacement. EXAM: OPERATIVE LEFT HIP (WITH PELVIS IF PERFORMED) 1 VIEWS TECHNIQUE: Fluoroscopic spot image(s) were submitted for interpretation post-operatively. COMPARISON:  Preop evaluation September 30, 2020 FINDINGS: Single intraoperative image is displayed showing signs of LEFT total hip replacement, acetabulum is incompletely visualized along the superior margin of the acetabular component. No signs of immediate complication on very limited AP view. Very limited imaging of the adjacent pelvis is provided. Tip of the femoral component is not imaged. FLUOROSCOPY TIME:  7 seconds. IMPRESSION: Intraoperative evaluation of LEFT hip arthroplasty with no apparent complication. LEFT femoral component is incompletely imaged. Acetabular component also incompletely  assessed with respect to the adjacent acetabulum along its superior margin. Attention on subsequent imaging is suggested. Electronically Signed   By: Donzetta Kohut M.D.   On: 10/04/2020 19:26   DG Hip Unilat With Pelvis 2-3 Views Left  Result Date: 09/30/2020 CLINICAL DATA:  Left hip pain after fall. EXAM: DG HIP (WITH OR WITHOUT PELVIS) 2-3V LEFT COMPARISON:  None. FINDINGS: Moderately displaced proximal left femoral neck fracture is noted. IMPRESSION: Moderately displaced proximal left femoral neck fracture.  Electronically Signed   By: Lupita Raider M.D.   On: 09/30/2020 18:43    (Echo, Carotid, EGD, Colonoscopy, ERCP)    Subjective: Seen and examined on the day of discharge.  Pleasant, no distress.  No pain complaints.  Stable for discharge to skilled nursing facility. Discharge Exam: Vitals:   10/08/20 0541 10/08/20 0748  BP: 130/75 (!) 129/56  Pulse: 70 69  Resp: 16 15  Temp: 97.9 F (36.6 C) 98.2 F (36.8 C)  SpO2: 100% 100%   Vitals:   10/07/20 1614 10/07/20 2130 10/08/20 0541 10/08/20 0748  BP: 136/71 135/79 130/75 (!) 129/56  Pulse: 70 84 70 69  Resp: Temp: 98 F (36.7 C) 98.2 F (36.8 C) 97.9 F (36.6 C) 98.2 F (36.8 C)  TempSrc:  Oral    SpO2: 100% 98% 100% 100%  Weight:      Height:        General: Pt is alert, awake, not in acute distress Cardiovascular: RRR, S1/S2 +, no rubs, no gallops Respiratory: CTA bilaterally, no wheezing, no rhonchi Abdominal: Soft, NT, ND, bowel sounds + Extremities: no edema, no cyanosis    The results of significant diagnostics from this hospitalization (including imaging, microbiology, ancillary and laboratory) are listed below for reference.     Microbiology: Recent Results (from the past 240 hour(s))  SARS CORONAVIRUS 2 (TAT 6-24 HRS) Nasopharyngeal Nasopharyngeal Swab     Status: None   Collection Time: 10/01/20  1:32 AM   Specimen: Nasopharyngeal Swab  Result Value Ref Range Status   SARS Coronavirus 2 NEGATIVE NEGATIVE Final    Comment: (NOTE) SARS-CoV-2 target nucleic acids are NOT DETECTED.  The SARS-CoV-2 RNA is generally detectable in upper and lower respiratory specimens during the acute phase of infection. Negative results do not preclude SARS-CoV-2 infection, do not rule out co-infections with other pathogens, and should not be used as the sole basis for treatment or other patient management decisions. Negative results must be combined with clinical observations, patient history, and  epidemiological information. The expected result is Negative.  Fact Sheet for Patients: HairSlick.no  Fact Sheet for Healthcare Providers: quierodirigir.com  This test is not yet approved or cleared by the [place] FDA and  has been authorized for detection and/or diagnosis of SARS-CoV-2 by FDA under an Emergency Use Authorization (EUA). This EUA will remain  in effect (meaning this test can be used) for the duration of the COVID-19 declaration under Se ction 564(b)(1) of the Act, 21 U.S.C. section 360bbb-3(b)(1), unless the authorization is terminated or revoked sooner.  Performed at [facility], [address] [place], [place] [address]   Resp Panel by RT-PCR (Flu A&B, Covid) Nasopharyngeal Swab     Status: None   Collection Time: 10/07/20  3:52 PM   Specimen: Nasopharyngeal Swab; Nasopharyngeal(NP) swabs in vial transport medium  Result Value Ref Range Status   SARS Coronavirus 2 by RT PCR NEGATIVE NEGATIVE Final    Comment: (NOTE) SARS-CoV-2  target nucleic acids are NOT DETECTED.  The SARS-CoV-2 RNA is generally detectable in upper respiratory specimens during the acute phase of infection. The lowest concentration of SARS-CoV-2 viral copies this assay can detect is 138 copies/mL. A negative result does not preclude SARS-Cov-2 infection and should not be used as the sole basis for treatment or other patient management decisions. A negative result may occur with  improper specimen collection/handling, submission of specimen other than nasopharyngeal swab, presence of viral mutation(s) within the areas targeted by this assay, and inadequate number of viral copies(<138 copies/mL). A negative result must be combined with clinical observations, patient history, and epidemiological information. The expected result is Negative.  Fact Sheet for Patients:  BloggerCourse.comhttps://www.fda.gov/media/152166/download  Fact Sheet for  Healthcare Providers:  SeriousBroker.ithttps://www.fda.gov/media/152162/download  This test is no t yet approved or cleared by the [place] FDA and  has been authorized for detection and/or diagnosis of SARS-CoV-2 by FDA under an Emergency Use Authorization (EUA). This EUA will remain  in effect (meaning this test can be used) for the duration of the COVID-19 declaration under Section 564(b)(1) of the Act, 21 U.S.C.section 360bbb-3(b)(1), unless the authorization is terminated  or revoked sooner.       Influenza A by PCR NEGATIVE NEGATIVE Final   Influenza B by PCR NEGATIVE NEGATIVE Final    Comment: (NOTE) The Xpert Xpress SARS-CoV-2/FLU/RSV plus assay is intended as an aid in the diagnosis of influenza from Nasopharyngeal swab specimens and should not be used as a sole basis for treatment. Nasal washings and aspirates are unacceptable for Xpert Xpress SARS-CoV-2/FLU/RSV testing.  Fact Sheet for Patients: BloggerCourse.comhttps://www.fda.gov/media/152166/download  Fact Sheet for Healthcare Providers: SeriousBroker.ithttps://www.fda.gov/media/152162/download  This test is not yet approved or cleared by the [place] FDA and has been authorized for detection and/or diagnosis of SARS-CoV-2 by FDA under an Emergency Use Authorization (EUA). This EUA will remain in effect (meaning this test can be used) for the duration of the COVID-19 declaration under Section 564(b)(1) of the Act, 21 U.S.C. section 360bbb-3(b)(1), unless the authorization is terminated or revoked.  Performed at [facility], [address] [place], [place] [id number]      Labs: BNP (last 3 results) No results for input(s): BNP in the last 8760 hours. Basic Metabolic Panel: Recent Labs  Lab 10/02/20 0524 10/05/20 0520 10/06/20 0830 10/07/20 0737  NA 134* 131* 129* 130*  K 4.2 4.5 4.7 4.6  CL 98 95* 93* 94*  CO2 28 28 28 27   GLUCOSE 114* 155* 113* 104*  BUN 28* 45* 43* 44*  CREATININE 1.35* 1.48* 1.53* 1.41*  CALCIUM 9.0 8.3*  8.5* 8.4*   Liver Function Tests: No results for input(s): AST, ALT, ALKPHOS, BILITOT, PROT, ALBUMIN in the last 168 hours. No results for input(s): LIPASE, AMYLASE in the last 168 hours. No results for input(s): AMMONIA in the last 168 hours. CBC: Recent Labs  Lab 10/02/20 0524 10/05/20 0520 10/07/20 0737  WBC 12.0* 12.1* 11.6*  NEUTROABS 8.8* 9.7* 9.1*  HGB 11.1* 9.4* 9.0*  HCT 33.0* 28.9* 26.9*  MCV 93.5 93.5 92.1  PLT 177 199 253   Cardiac Enzymes: No results for input(s): CKTOTAL, CKMB, CKMBINDEX, TROPONINI in the last 168 hours. BNP: Invalid input(s): POCBNP CBG: No results for input(s): GLUCAP in the last 168 hours. D-Dimer No results for input(s): DDIMER in the last 72 hours. Hgb A1c No results for input(s): HGBA1C in the last 72 hours. Lipid Profile No results for input(s): CHOL, HDL, LDLCALC, TRIG, CHOLHDL, LDLDIRECT in  the last 72 hours. Thyroid function studies No results for input(s): TSH, T4TOTAL, T3FREE, THYROIDAB in the last 72 hours.  Invalid input(s): FREET3 Anemia work up No results for input(s): VITAMINB12, FOLATE, FERRITIN, TIBC, IRON, RETICCTPCT in the last 72 hours. Urinalysis    Component Value Date/Time   COLORURINE YELLOW 10/07/2019 1040   APPEARANCEUR CLEAR 10/07/2019 1040   LABSPEC 1.015 10/07/2019 1040   PHURINE 5.5 10/07/2019 1040   GLUCOSEU NEGATIVE 10/07/2019 1040   HGBUR LARGE (A) 10/07/2019 1040   BILIRUBINUR NEGATIVE 10/07/2019 1040   KETONESUR NEGATIVE 10/07/2019 1040   PROTEINUR NEGATIVE 10/07/2019 1040   NITRITE NEGATIVE 10/07/2019 1040   LEUKOCYTESUR TRACE (A) 10/07/2019 1040   Sepsis Labs Invalid input(s): PROCALCITONIN,  WBC,  LACTICIDVEN Microbiology Recent Results (from the past 240 hour(s))  SARS CORONAVIRUS 2 (TAT 6-24 HRS) Nasopharyngeal Nasopharyngeal Swab     Status: None   Collection Time: 10/01/20  1:32 AM   Specimen: Nasopharyngeal Swab  Result Value Ref Range Status   SARS Coronavirus 2 NEGATIVE NEGATIVE  Final    Comment: (NOTE) SARS-CoV-2 target nucleic acids are NOT DETECTED.  The SARS-CoV-2 RNA is generally detectable in upper and lower respiratory specimens during the acute phase of infection. Negative results do not preclude SARS-CoV-2 infection, do not rule out co-infections with other pathogens, and should not be used as the sole basis for treatment or other patient management decisions. Negative results must be combined with clinical observations, patient history, and epidemiological information. The expected result is Negative.  Fact Sheet for Patients: HairSlick.no  Fact Sheet for Healthcare Providers: quierodirigir.com  This test is not yet approved or cleared by the [place] FDA and  has been authorized for detection and/or diagnosis of SARS-CoV-2 by FDA under an Emergency Use Authorization (EUA). This EUA will remain  in effect (meaning this test can be used) for the duration of the COVID-19 declaration under Se ction 564(b)(1) of the Act, 21 U.S.C. section 360bbb-3(b)(1), unless the authorization is terminated or revoked sooner.  Performed at [facility], [address] [place], [place] [address]   Resp Panel by RT-PCR (Flu A&B, Covid) Nasopharyngeal Swab     Status: None   Collection Time: 10/07/20  3:52 PM   Specimen: Nasopharyngeal Swab; Nasopharyngeal(NP) swabs in vial transport medium  Result Value Ref Range Status   SARS Coronavirus 2 by RT PCR NEGATIVE NEGATIVE Final    Comment: (NOTE) SARS-CoV-2 target nucleic acids are NOT DETECTED.  The SARS-CoV-2 RNA is generally detectable in upper respiratory specimens during the acute phase of infection. The lowest concentration of SARS-CoV-2 viral copies this assay can detect is 138 copies/mL. A negative result does not preclude SARS-Cov-2 infection and should not be used as the sole basis for treatment or other patient management decisions. A  negative result may occur with  improper specimen collection/handling, submission of specimen other than nasopharyngeal swab, presence of viral mutation(s) within the areas targeted by this assay, and inadequate number of viral copies(<138 copies/mL). A negative result must be combined with clinical observations, patient history, and epidemiological information. The expected result is Negative.  Fact Sheet for Patients:  BloggerCourse.com  Fact Sheet for Healthcare Providers:  SeriousBroker.it  This test is no t yet approved or cleared by the [place] FDA and  has been authorized for detection and/or diagnosis of SARS-CoV-2 by FDA under an Emergency Use Authorization (EUA). This EUA will remain  in effect (meaning this test can be used) for the duration of  the COVID-19 declaration under Section 564(b)(1) of the Act, 21 U.S.C.section 360bbb-3(b)(1), unless the authorization is terminated  or revoked sooner.       Influenza A by PCR NEGATIVE NEGATIVE Final   Influenza B by PCR NEGATIVE NEGATIVE Final    Comment: (NOTE) The Xpert Xpress SARS-CoV-2/FLU/RSV plus assay is intended as an aid in the diagnosis of influenza from Nasopharyngeal swab specimens and should not be used as a sole basis for treatment. Nasal washings and aspirates are unacceptable for Xpert Xpress SARS-CoV-2/FLU/RSV testing.  Fact Sheet for Patients: BloggerCourse.com  Fact Sheet for Healthcare Providers: SeriousBroker.it  This test is not yet approved or cleared by the [place] FDA and has been authorized for detection and/or diagnosis of SARS-CoV-2 by FDA under an Emergency Use Authorization (EUA). This EUA will remain in effect (meaning this test can be used) for the duration of the COVID-19 declaration under Section 564(b)(1) of the Act, 21 U.S.C. section 360bbb-3(b)(1), unless the authorization  is terminated or revoked.  Performed at [facility], [address] [place], [place] [address]      Time coordinating discharge: Over 30 minutes  SIGNED:   Tresa Moore, MD  [facility] 10/08/2020, 8:42 AM Pager   If 7PM-7AM, please contact night-coverage

## 2020-10-08 NOTE — Care Management Important Message (Signed)
Important Message  Patient Details  Name: JALESIA LOUDENSLAGER MRN: [id number] Date of Birth: 1932/04/19   Medicare Important Message Given:  Yes     Olegario Messier A Tonantzin Mimnaugh 10/08/2020, 9:27 AM

## 2020-10-08 NOTE — TOC Transition Note (Signed)
Transition of Care [facility]) - CM/SW Discharge Note   Patient Details  Name: Heather [name] MRN: [id number] Date of Birth: 08-20-1931  Transition of Care [facility]) CM/SW Contact:  Caryn Section, RN Phone Number: 10/08/2020, 12:36 PM   Clinical Narrative:   TOC in to see patient and daughter at bedside.  EMS arriving to bring patient to [facility], bed 508, patient and family amenable.  Nurse and NT aware of transfer, [facility] able to accept patient.  No further questions at this time, TOC signing off.      Barriers to Discharge: Awaiting State Approval (PASRR),Continued Medical Work up   Patient Goals and CMS Choice   CMS Medicare.gov Compare Post Acute Care list provided to:: Patient (Family at bedside)    Discharge Placement                       Discharge Plan and Services     Post Acute Care Choice: Skilled Nursing Facility                               Social Determinants of Health (SDOH) Interventions     Readmission Risk Interventions No flowsheet data found.

## 2021-11-28 ENCOUNTER — Encounter: Payer: Self-pay | Admitting: Ophthalmology

## 2021-12-01 NOTE — Discharge Instructions (Signed)

## 2021-12-05 ENCOUNTER — Ambulatory Visit
Admission: RE | Admit: 2021-12-05 | Discharge: 2021-12-05 | Disposition: A | Discharge status: Home / Self Care | Payer: Medicare Other | Attending: Ophthalmology | Admitting: Ophthalmology

## 2021-12-05 ENCOUNTER — Encounter: Payer: Self-pay | Admitting: Ophthalmology

## 2021-12-05 ENCOUNTER — Other Ambulatory Visit: Payer: Self-pay

## 2021-12-05 ENCOUNTER — Ambulatory Visit: Payer: Medicare Other | Admitting: Anesthesiology

## 2021-12-05 ENCOUNTER — Encounter
Admission: RE | Disposition: A | Discharge status: Home / Self Care | Payer: Self-pay | Source: Home / Self Care | Attending: Ophthalmology

## 2021-12-05 DIAGNOSIS — I509 Heart failure, unspecified: Secondary | ICD-10-CM | POA: Diagnosis not present

## 2021-12-05 DIAGNOSIS — H2512 Age-related nuclear cataract, left eye: Secondary | ICD-10-CM | POA: Diagnosis present

## 2021-12-05 DIAGNOSIS — I4891 Unspecified atrial fibrillation: Secondary | ICD-10-CM | POA: Insufficient documentation

## 2021-12-05 DIAGNOSIS — E039 Hypothyroidism, unspecified: Secondary | ICD-10-CM | POA: Insufficient documentation

## 2021-12-05 DIAGNOSIS — I110 Hypertensive heart disease with heart failure: Secondary | ICD-10-CM | POA: Diagnosis not present

## 2021-12-05 DIAGNOSIS — Z950 Presence of cardiac pacemaker: Secondary | ICD-10-CM | POA: Insufficient documentation

## 2021-12-05 HISTORY — DX: Gout, unspecified: M10.9

## 2021-12-05 HISTORY — DX: Presence of dental prosthetic device (complete) (partial): Z97.2

## 2021-12-05 HISTORY — PX: CATARACT EXTRACTION W/PHACO: SHX586

## 2021-12-05 HISTORY — DX: Hypothyroidism, unspecified: E03.9

## 2021-12-05 HISTORY — DX: Other complications of anesthesia, initial encounter: T88.59XA

## 2021-12-05 SURGERY — PHACOEMULSIFICATION, CATARACT, WITH IOL INSERTION
Anesthesia: Monitor Anesthesia Care | Site: Eye | Laterality: Left

## 2021-12-05 MED ORDER — ONDANSETRON HCL 4 MG/2ML IJ SOLN
4.0000 mg | Freq: Once | INTRAMUSCULAR | Status: AC | PRN
  Administered 2021-12-05: 4 mg via INTRAVENOUS

## 2021-12-05 MED ORDER — SIGHTPATH DOSE#1 SODIUM HYALURONATE 23 MG/ML IO SOLUTION
PREFILLED_SYRINGE | INTRAOCULAR | Status: DC | PRN
Start: 2021-12-05 — End: 2021-12-05
  Administered 2021-12-05: 0.6 mL via INTRAOCULAR

## 2021-12-05 MED ORDER — TETRACAINE HCL 0.5 % OP SOLN
1.0000 [drp] | OPHTHALMIC | Status: DC | PRN
  Administered 2021-12-05 (×3): 1 [drp] via OPHTHALMIC

## 2021-12-05 MED ORDER — MOXIFLOXACIN HCL 0.5 % OP SOLN
OPHTHALMIC | Status: DC | PRN
  Administered 2021-12-05: 0.2 mL via OPHTHALMIC

## 2021-12-05 MED ORDER — LACTATED RINGERS IV SOLN: INTRAVENOUS | Status: DC

## 2021-12-05 MED ORDER — SIGHTPATH DOSE#1 BSS IO SOLN
INTRAOCULAR | Status: DC | PRN
Start: 2021-12-05 — End: 2021-12-05
  Administered 2021-12-05: 15 mL

## 2021-12-05 MED ORDER — LABETALOL HCL 5 MG/ML IV SOLN
INTRAVENOUS | Status: DC | PRN
  Administered 2021-12-05 (×2): 5 mg via INTRAVENOUS

## 2021-12-05 MED ORDER — EPINEPHRINE PF 1 MG/ML IJ SOLN
INTRAMUSCULAR | Status: DC | PRN
  Administered 2021-12-05: 76 mL via OPHTHALMIC

## 2021-12-05 MED ORDER — FENTANYL CITRATE (PF) 100 MCG/2ML IJ SOLN
INTRAMUSCULAR | Status: DC | PRN
  Administered 2021-12-05 (×2): 50 ug via INTRAVENOUS

## 2021-12-05 MED ORDER — SIGHTPATH DOSE#1 SODIUM HYALURONATE 10 MG/ML IO SOLUTION
PREFILLED_SYRINGE | INTRAOCULAR | Status: DC | PRN
Start: 2021-12-05 — End: 2021-12-05
  Administered 2021-12-05: 0.85 mL via INTRAOCULAR

## 2021-12-05 MED ORDER — ARMC OPHTHALMIC DILATING DROPS
1.0000 "application " | OPHTHALMIC | Status: DC | PRN
  Administered 2021-12-05 (×3): 1 via OPHTHALMIC

## 2021-12-05 MED ORDER — BALANCED SALT IO SOLN
INTRAOCULAR | Status: DC | PRN
  Administered 2021-12-05: 1 mL via INTRAOCULAR

## 2021-12-05 SURGICAL SUPPLY — 14 items
CATARACT SUITE SIGHTPATH (MISCELLANEOUS) ×2 IMPLANT
DISSECTOR HYDRO NUCLEUS 50X22 (MISCELLANEOUS) ×2 IMPLANT
FEE CATARACT SUITE SIGHTPATH (MISCELLANEOUS) ×1 IMPLANT
GLOVE SURG GAMMEX PI TX LF 7.5 (GLOVE) ×2 IMPLANT
GLOVE SURG SYN 8.5  E (GLOVE) ×1
GLOVE SURG SYN 8.5 E (GLOVE) ×1 IMPLANT
GLOVE SURG SYN 8.5 PF PI (GLOVE) ×1 IMPLANT
LENS IOL TECNIS EYHANCE 22.0 (Intraocular Lens) ×1 IMPLANT
NDL FILTER BLUNT 18X1 1/2 (NEEDLE) ×1 IMPLANT
NEEDLE FILTER BLUNT 18X 1/2SAF (NEEDLE) ×1
NEEDLE FILTER BLUNT 18X1 1/2 (NEEDLE) ×1 IMPLANT
SYR 3ML LL SCALE MARK (SYRINGE) ×2 IMPLANT
SYR 5ML LL (SYRINGE) ×2 IMPLANT
WATER STERILE IRR 250ML POUR (IV SOLUTION) ×2 IMPLANT

## 2021-12-05 NOTE — Op Note (Signed)
OPERATIVE NOTE  MAHUM GRABEN [id number] 12/05/2021   PREOPERATIVE DIAGNOSIS:  Nuclear sclerotic cataract left eye.  H25.12   POSTOPERATIVE DIAGNOSIS:    Nuclear sclerotic cataract left eye.     PROCEDURE:  Phacoemusification with posterior chamber intraocular lens placement of the left eye   LENS:   Implant Name Type Inv. Item Serial No. Manufacturer Lot No. LRB No. Used Action  LENS IOL TECNIS EYHANCE 22.0 - [id number] Intraocular Lens LENS IOL TECNIS EYHANCE 22.0 [id number] [employer]  Left 1 Implanted      Procedure(s): CATARACT EXTRACTION PHACO AND INTRAOCULAR LENS PLACEMENT (IOC) LEFT 3.72 00:37.1 (Left)  DIB00 +22.0   ULTRASOUND TIME: 0 minutes 37 seconds.  CDE 3.72   SURGEON:  Benay Pillow, MD, MPH   ANESTHESIA:  Topical with tetracaine drops augmented with 1% preservative-free intracameral lidocaine.  ESTIMATED BLOOD LOSS: <1 mL   COMPLICATIONS:  None.   DESCRIPTION OF PROCEDURE:  The patient was identified in the holding room and transported to the operating room and placed in the supine position under the operating microscope.  The left eye was identified as the operative eye and it was prepped and draped in the usual sterile ophthalmic fashion.   A 1.0 millimeter clear-corneal paracentesis was made at the 5:00 position. 0.5 ml of preservative-free 1% lidocaine with epinephrine was injected into the anterior chamber.  The anterior chamber was filled with Healon 5 viscoelastic.  A 2.4 millimeter keratome was used to make a near-clear corneal incision at the 2:00 position.  A curvilinear capsulorrhexis was made with a cystotome and capsulorrhexis forceps.  Balanced salt solution was used to hydrodissect and hydrodelineate the nucleus.   Phacoemulsification was then used in stop and chop fashion to remove the lens nucleus and epinucleus.  The remaining cortex was then removed using the irrigation and aspiration handpiece. Healon was then placed into the capsular bag to  distend it for lens placement.  A lens was then injected into the capsular bag.  The remaining viscoelastic was aspirated.   Wounds were hydrated with balanced salt solution.  The anterior chamber was inflated to a physiologic pressure with balanced salt solution.  Intracameral vigamox 0.1 mL undiltued was injected into the eye and a drop placed onto the ocular surface.  No wound leaks were noted.  The patient was taken to the recovery room in stable condition without complications of anesthesia or surgery  Benay Pillow 12/05/2021, 10:54 AM

## 2021-12-05 NOTE — H&P (Signed)
[place]   Primary Care Physician:  Tracie Harrier, MD Ophthalmologist: Dr. Benay Pillow  Pre-Procedure History & Physical: HPI:  Heather [name] is a 88 y.o. female here for cataract surgery.   Past Medical History:  Diagnosis Date   Anxiety    Atrial fibrillation (HCC)    CHF (congestive heart failure) ([place])    Complication of anesthesia    Given unknown med at time of temp pacemaker placement that caused nausea   GERD (gastroesophageal reflux disease)    Gout    Hypertension    Hypothyroidism    Presence of permanent cardiac pacemaker 04/23/2020   Thyroid disease    Wears dentures    full; upper    Past Surgical History:  Procedure Laterality Date   ABDOMINAL HYSTERECTOMY     APPENDECTOMY     HIP ARTHROPLASTY Left 10/04/2020   Procedure: ARTHROPLASTY BIPOLAR HIP (HEMIARTHROPLASTY);  Surgeon: Lovell Sheehan, MD;  Location: [facility] ORS;  Service: Orthopedics;  Laterality: Left;   INSERT / REPLACE / REMOVE PACEMAKER  04/23/2020    Prior to Admission medications   Medication Sig Start Date End Date Taking? Authorizing Provider  apixaban (ELIQUIS) 5 MG TABS tablet Take by mouth. 07/02/16  Yes [provider]  Dextran 70-Hypromellose (ARTIFICIAL TEARS) 0.1-0.3 % SOLN Apply to eye.   Yes [provider]  levothyroxine (SYNTHROID) 50 MCG tablet TAKE 1 TABLET BY MOUTH EVERY DAY 06/21/17  Yes [provider]  losartan (COZAAR) 50 MG tablet Take 50 mg by mouth daily. 08/04/20  Yes [provider]  magnesium 30 MG tablet Take 30 mg by mouth.   Yes [provider]  Melatonin 1 MG CAPS Take by mouth at bedtime as needed.   Yes [provider]  metoprolol succinate (TOPROL-XL) 50 MG 24 hr tablet Take 50 mg by mouth daily. 08/29/19 12/05/21 Yes [provider]  Multiple Vitamins-Minerals (MULTIVITAMIN ADULT, MINERALS, PO) Take 1 tablet by mouth daily.   Yes [provider]  senna-docusate (SENOKOT-S) 8.6-50  MG tablet Take 1 tablet by mouth 2 (two) times daily. 10/08/20  Yes Sreenath, Sudheer B, MD  sertraline (ZOLOFT) 50 MG tablet Take 50 mg by mouth at bedtime. 09/23/19 12/05/21 Yes [provider]  torsemide (DEMADEX) 20 MG tablet Take 20 mg by mouth daily. 08/29/19 12/05/21 Yes [provider]    Allergies as of 10/31/2021 - Review Complete 10/04/2020  Allergen Reaction Noted   Other Shortness Of Breath 10/23/2016   Codeine Other (See Comments) 07/20/2016   Sulfa antibiotics Other (See Comments) 08/09/2016   Sulfamethoxazole-trimethoprim  02/10/2013    Family History  Family history unknown: Yes    Social History   Socioeconomic History   Marital status: Widowed    Spouse name: Not on file   Number of children: Not on file   Years of education: Not on file   Highest education level: Not on file  Occupational History   Not on file  Tobacco Use   Smoking status: Former    Types: Cigarettes    Quit date: 48    Years since quitting: 66.4   Smokeless tobacco: Never  Vaping Use   Vaping Use: Never used  Substance and Sexual Activity   Alcohol use: No   Drug use: No   Sexual activity: Yes    Birth control/protection: Post-menopausal  Other Topics Concern   Not on file  Social History Narrative   Not on file   Social Determinants of Health  Financial Resource Strain: Not on file  Food Insecurity: Not on file  Transportation Needs: Not on file  Physical Activity: Not on file  Stress: Not on file  Social Connections: Not on file  Intimate Partner Violence: Not on file    Review of Systems: See HPI, otherwise negative ROS  Physical Exam: BP (!) 176/80   Pulse 70   Temp (!) 97.2 F (36.2 C) (Temporal)   Ht 5\' 3"  (1.6 m)   Wt 94.8 kg   SpO2 96%   BMI 37.00 kg/m  General:   Alert, cooperative in NAD Head:  Normocephalic and atraumatic. Respiratory:  Normal work of breathing. Cardiovascular:  RRR  Impression/Plan: [name] is here for  cataract surgery.  Risks, benefits, limitations, and alternatives regarding cataract surgery have been reviewed with the patient.  Questions have been answered.  All parties agreeable.   Benay Pillow, MD  12/05/2021, 10:25 AM

## 2021-12-05 NOTE — Anesthesia Preprocedure Evaluation (Signed)
Anesthesia Evaluation  Patient identified by MRN, date of birth, ID band Patient awake    Reviewed: Allergy & Precautions, H&P , NPO status , Patient's Chart, lab work & pertinent test results, reviewed documented beta blocker date and time   History of Anesthesia Complications (+) PONV and history of anesthetic complications  Airway Mallampati: II  TM Distance: >3 FB Neck ROM: full    Dental no notable dental hx.    Pulmonary neg pulmonary ROS, former smoker,    Pulmonary exam normal breath sounds clear to auscultation       Cardiovascular Exercise Tolerance: Good hypertension, negative cardio ROS  + dysrhythmias (afib) + pacemaker (SSS, Bos-Sci CRT bi-V PM)  Rhythm:regular Rate:Normal     Neuro/Psych negative neurological ROS  negative psych ROS   GI/Hepatic negative GI ROS, Neg liver ROS, GERD  ,  Endo/Other  negative endocrine ROSHypothyroidism   Renal/GU negative Renal ROS  negative genitourinary   Musculoskeletal   Abdominal   Peds  Hematology negative hematology ROS (+)   Anesthesia Other Findings   Reproductive/Obstetrics negative OB ROS                             Anesthesia Physical Anesthesia Plan  ASA: 4  Anesthesia Plan: MAC   Post-op Pain Management:    Induction:   PONV Risk Score and Plan: 3 and TIVA, Midazolam and Treatment may vary due to age or medical condition  Airway Management Planned:   Additional Equipment:   Intra-op Plan:   Post-operative Plan:   Informed Consent: I have reviewed the patients History and Physical, chart, labs and discussed the procedure including the risks, benefits and alternatives for the proposed anesthesia with the patient or authorized representative who has indicated his/her understanding and acceptance.     Dental Advisory Given  Plan Discussed with: CRNA  Anesthesia Plan Comments:         Anesthesia Quick  Evaluation

## 2021-12-05 NOTE — Anesthesia Postprocedure Evaluation (Signed)
Anesthesia Post Note  Patient: Heather [name]  Procedure(s) Performed: CATARACT EXTRACTION PHACO AND INTRAOCULAR LENS PLACEMENT (IOC) LEFT 3.72 00:37.1 (Left: Eye)     Patient location during evaluation: PACU Anesthesia Type: MAC Level of consciousness: awake and alert Pain management: pain level controlled Vital Signs Assessment: post-procedure vital signs reviewed and stable Respiratory status: spontaneous breathing, nonlabored ventilation and respiratory function stable Cardiovascular status: stable and blood pressure returned to baseline Postop Assessment: no apparent nausea or vomiting Anesthetic complications: no   No notable events documented.  [name]

## 2021-12-05 NOTE — Transfer of Care (Signed)
Immediate Anesthesia Transfer of Care Note  Patient: Heather [name]  Procedure(s) Performed: CATARACT EXTRACTION PHACO AND INTRAOCULAR LENS PLACEMENT (IOC) LEFT 3.72 00:37.1 (Left: Eye)  Patient Location: PACU  Anesthesia Type: MAC  Level of Consciousness: awake, alert  and patient cooperative  Airway and Oxygen Therapy: Patient Spontanous Breathing and Patient connected to supplemental oxygen  Post-op Assessment: Post-op Vital signs reviewed, Patient's Cardiovascular Status Stable, Respiratory Function Stable, Patent Airway and No signs of Nausea or vomiting  Post-op Vital Signs: Reviewed and stable  Complications: No notable events documented.

## 2021-12-06 ENCOUNTER — Encounter: Payer: Self-pay | Admitting: Ophthalmology

## 2021-12-15 NOTE — Discharge Instructions (Signed)

## 2021-12-19 ENCOUNTER — Ambulatory Visit: Payer: Medicare Other | Admitting: Anesthesiology

## 2021-12-19 ENCOUNTER — Encounter: Payer: Self-pay | Admitting: Ophthalmology

## 2021-12-19 ENCOUNTER — Other Ambulatory Visit: Payer: Self-pay

## 2021-12-19 ENCOUNTER — Encounter
Admission: RE | Disposition: A | Discharge status: Home / Self Care | Payer: Self-pay | Source: Home / Self Care | Attending: Ophthalmology

## 2021-12-19 ENCOUNTER — Ambulatory Visit
Admission: RE | Admit: 2021-12-19 | Discharge: 2021-12-19 | Disposition: A | Discharge status: Home / Self Care | Payer: Medicare Other | Attending: Ophthalmology | Admitting: Ophthalmology

## 2021-12-19 DIAGNOSIS — E039 Hypothyroidism, unspecified: Secondary | ICD-10-CM | POA: Diagnosis not present

## 2021-12-19 DIAGNOSIS — I442 Atrioventricular block, complete: Secondary | ICD-10-CM | POA: Insufficient documentation

## 2021-12-19 DIAGNOSIS — F419 Anxiety disorder, unspecified: Secondary | ICD-10-CM | POA: Diagnosis not present

## 2021-12-19 DIAGNOSIS — I509 Heart failure, unspecified: Secondary | ICD-10-CM | POA: Diagnosis not present

## 2021-12-19 DIAGNOSIS — Z79899 Other long term (current) drug therapy: Secondary | ICD-10-CM | POA: Insufficient documentation

## 2021-12-19 DIAGNOSIS — Z6835 Body mass index (BMI) 35.0-35.9, adult: Secondary | ICD-10-CM | POA: Insufficient documentation

## 2021-12-19 DIAGNOSIS — M109 Gout, unspecified: Secondary | ICD-10-CM | POA: Diagnosis not present

## 2021-12-19 DIAGNOSIS — Z87891 Personal history of nicotine dependence: Secondary | ICD-10-CM | POA: Diagnosis not present

## 2021-12-19 DIAGNOSIS — Z79890 Hormone replacement therapy (postmenopausal): Secondary | ICD-10-CM | POA: Diagnosis not present

## 2021-12-19 DIAGNOSIS — H2511 Age-related nuclear cataract, right eye: Secondary | ICD-10-CM | POA: Insufficient documentation

## 2021-12-19 DIAGNOSIS — I4891 Unspecified atrial fibrillation: Secondary | ICD-10-CM | POA: Diagnosis not present

## 2021-12-19 DIAGNOSIS — Z950 Presence of cardiac pacemaker: Secondary | ICD-10-CM | POA: Diagnosis not present

## 2021-12-19 DIAGNOSIS — I110 Hypertensive heart disease with heart failure: Secondary | ICD-10-CM | POA: Diagnosis not present

## 2021-12-19 DIAGNOSIS — Z7901 Long term (current) use of anticoagulants: Secondary | ICD-10-CM | POA: Diagnosis not present

## 2021-12-19 HISTORY — PX: CATARACT EXTRACTION W/PHACO: SHX586

## 2021-12-19 SURGERY — PHACOEMULSIFICATION, CATARACT, WITH IOL INSERTION
Anesthesia: Monitor Anesthesia Care | Site: Eye | Laterality: Right

## 2021-12-19 MED ORDER — SIGHTPATH DOSE#1 BSS IO SOLN
INTRAOCULAR | Status: DC | PRN
  Administered 2021-12-19: 70 mL via OPHTHALMIC

## 2021-12-19 MED ORDER — TETRACAINE HCL 0.5 % OP SOLN
1.0000 [drp] | OPHTHALMIC | Status: DC | PRN
  Administered 2021-12-19 (×3): 1 [drp] via OPHTHALMIC

## 2021-12-19 MED ORDER — OXYCODONE HCL 5 MG PO TABS: 5.0000 mg | ORAL_TABLET | Freq: Once | ORAL | Status: DC | PRN

## 2021-12-19 MED ORDER — LIDOCAINE HCL (PF) 2 % IJ SOLN
INTRAOCULAR | Status: DC | PRN
  Administered 2021-12-19: 4 mL via INTRAOCULAR

## 2021-12-19 MED ORDER — FENTANYL CITRATE (PF) 100 MCG/2ML IJ SOLN
INTRAMUSCULAR | Status: DC | PRN
  Administered 2021-12-19: 50 ug via INTRAVENOUS

## 2021-12-19 MED ORDER — OXYCODONE HCL 5 MG/5ML PO SOLN: 5.0000 mg | Freq: Once | ORAL | Status: DC | PRN

## 2021-12-19 MED ORDER — SIGHTPATH DOSE#1 SODIUM HYALURONATE 23 MG/ML IO SOLUTION
PREFILLED_SYRINGE | INTRAOCULAR | Status: DC | PRN
  Administered 2021-12-19: 0.6 mL via INTRAOCULAR

## 2021-12-19 MED ORDER — SIGHTPATH DOSE#1 SODIUM HYALURONATE 10 MG/ML IO SOLUTION
PREFILLED_SYRINGE | INTRAOCULAR | Status: DC | PRN
  Administered 2021-12-19: 0.85 mL via INTRAOCULAR

## 2021-12-19 MED ORDER — SIGHTPATH DOSE#1 BSS IO SOLN
INTRAOCULAR | Status: DC | PRN
  Administered 2021-12-19: 15 mL via INTRAOCULAR

## 2021-12-19 MED ORDER — ARMC OPHTHALMIC DILATING DROPS
1.0000 "application " | OPHTHALMIC | Status: DC | PRN
  Administered 2021-12-19 (×3): 1 via OPHTHALMIC

## 2021-12-19 MED ORDER — MOXIFLOXACIN HCL 0.5 % OP SOLN
OPHTHALMIC | Status: DC | PRN
  Administered 2021-12-19: 0.2 mL via OPHTHALMIC

## 2021-12-19 MED ORDER — LACTATED RINGERS IV SOLN: INTRAVENOUS | Status: DC

## 2021-12-19 SURGICAL SUPPLY — 16 items
CANNULA ANT/CHMB 27G (MISCELLANEOUS) IMPLANT
CANNULA ANT/CHMB 27GA (MISCELLANEOUS) IMPLANT
CATARACT SUITE SIGHTPATH (MISCELLANEOUS) ×2 IMPLANT
DISSECTOR HYDRO NUCLEUS 50X22 (MISCELLANEOUS) ×2 IMPLANT
FEE CATARACT SUITE SIGHTPATH (MISCELLANEOUS) ×1 IMPLANT
GLOVE SURG GAMMEX PI TX LF 7.5 (GLOVE) ×2 IMPLANT
GLOVE SURG SYN 8.5  E (GLOVE) ×1
GLOVE SURG SYN 8.5 E (GLOVE) ×1 IMPLANT
GLOVE SURG SYN 8.5 PF PI (GLOVE) ×1 IMPLANT
LENS IOL TECNIS EYHANCE 22.0 (Intraocular Lens) ×1 IMPLANT
NDL FILTER BLUNT 18X1 1/2 (NEEDLE) ×1 IMPLANT
NEEDLE FILTER BLUNT 18X 1/2SAF (NEEDLE) ×1
NEEDLE FILTER BLUNT 18X1 1/2 (NEEDLE) ×1 IMPLANT
SYR 3ML LL SCALE MARK (SYRINGE) ×2 IMPLANT
SYR 5ML LL (SYRINGE) ×2 IMPLANT
WATER STERILE IRR 250ML POUR (IV SOLUTION) ×2 IMPLANT

## 2021-12-19 NOTE — Transfer of Care (Signed)
Immediate Anesthesia Transfer of Care Note  Patient: Heather [name]  Procedure(s) Performed: CATARACT EXTRACTION PHACO AND INTRAOCULAR LENS PLACEMENT (IOC) RIGHT 3.43 00:30.0 (Right: Eye)  Patient Location: PACU  Anesthesia Type: MAC  Level of Consciousness: awake, alert  and patient cooperative  Airway and Oxygen Therapy: Patient Spontanous Breathing and Patient connected to supplemental oxygen  Post-op Assessment: Post-op Vital signs reviewed, Patient's Cardiovascular Status Stable, Respiratory Function Stable, Patent Airway and No signs of Nausea or vomiting  Post-op Vital Signs: Reviewed and stable  Complications: No notable events documented.

## 2021-12-19 NOTE — Op Note (Signed)
OPERATIVE NOTE  Heather [name] [id number] 12/19/2021   PREOPERATIVE DIAGNOSIS:  Nuclear sclerotic cataract right eye.  H25.11   POSTOPERATIVE DIAGNOSIS:    Nuclear sclerotic cataract right eye.     PROCEDURE:  Phacoemusification with posterior chamber intraocular lens placement of the right eye   LENS:   Implant Name Type Inv. Item Serial No. Manufacturer Lot No. LRB No. Used Action  LENS IOL TECNIS EYHANCE 22.0 - [id number] Intraocular Lens LENS IOL TECNIS EYHANCE 22.0 [id number] [employer]  Right 1 Implanted       Procedure(s): CATARACT EXTRACTION PHACO AND INTRAOCULAR LENS PLACEMENT (IOC) RIGHT 3.43 00:30.0 (Right)  DIB00 +22.0   ULTRASOUND TIME: 0 minutes 30 seconds.  CDE 3.43   SURGEON:  Willey Blade, MD, MPH  ANESTHESIOLOGIST: Anesthesiologist: Orrin Brigham, MD CRNA: Derenda Mis, CRNA   ANESTHESIA:  Topical with tetracaine drops augmented with 1% preservative-free intracameral lidocaine.  ESTIMATED BLOOD LOSS: less than 1 mL.   COMPLICATIONS:  None.   DESCRIPTION OF PROCEDURE:  The patient was identified in the holding room and transported to the operating room and placed in the supine position under the operating microscope.  The right eye was identified as the operative eye and it was prepped and draped in the usual sterile ophthalmic fashion.   A 1.0 millimeter clear-corneal paracentesis was made at the 10:30 position. 0.5 ml of preservative-free 1% lidocaine with epinephrine was injected into the anterior chamber.  The anterior chamber was filled with Healon 5 viscoelastic.  A 2.4 millimeter keratome was used to make a near-clear corneal incision at the 8:00 position.  A curvilinear capsulorrhexis was made with a cystotome and capsulorrhexis forceps.  Balanced salt solution was used to hydrodissect and hydrodelineate the nucleus.   Phacoemulsification was then used in stop and chop fashion to remove the lens nucleus and epinucleus.  The remaining cortex was  then removed using the irrigation and aspiration handpiece. Healon was then placed into the capsular bag to distend it for lens placement.  A lens was then injected into the capsular bag.  The remaining viscoelastic was aspirated.   Wounds were hydrated with balanced salt solution.  The anterior chamber was inflated to a physiologic pressure with balanced salt solution.   Intracameral vigamox 0.1 mL undiluted was injected into the eye and a drop placed onto the ocular surface.  No wound leaks were noted.  The patient was taken to the recovery room in stable condition without complications of anesthesia or surgery  Willey Blade 12/19/2021, 11:08 AM

## 2021-12-19 NOTE — H&P (Signed)
[facility]   Primary Care Physician:  Barbette Reichmann, MD Ophthalmologist: Dr. Willey Blade  Pre-Procedure History & Physical: HPI:  Heather [name] is a 88 y.o. female here for cataract surgery.   Past Medical History:  Diagnosis Date   Anxiety    Atrial fibrillation (HCC)    CHF (congestive heart failure) (HCC)    Complication of anesthesia    Given unknown med at time of temp pacemaker placement that caused nausea   GERD (gastroesophageal reflux disease)    Gout    Hypertension    Hypothyroidism    Presence of permanent cardiac pacemaker 04/23/2020   Thyroid disease    Wears dentures    full; upper    Past Surgical History:  Procedure Laterality Date   ABDOMINAL HYSTERECTOMY     APPENDECTOMY     CATARACT EXTRACTION W/PHACO Left 12/05/2021   Procedure: CATARACT EXTRACTION PHACO AND INTRAOCULAR LENS PLACEMENT (IOC) LEFT 3.72 00:37.1;  Surgeon: Nevada Crane, MD;  Location: [facility];  Service: Ophthalmology;  Laterality: Left;   HIP ARTHROPLASTY Left 10/04/2020   Procedure: ARTHROPLASTY BIPOLAR HIP (HEMIARTHROPLASTY);  Surgeon: Lyndle Herrlich, MD;  Location: [facility];  Service: Orthopedics;  Laterality: Left;   INSERT / REPLACE / REMOVE PACEMAKER  04/23/2020    Prior to Admission medications   Medication Sig Start Date End Date Taking? Authorizing Provider  apixaban (ELIQUIS) 5 MG TABS tablet Take by mouth. 07/02/16  Yes [provider]  Dextran 70-Hypromellose (ARTIFICIAL TEARS) 0.1-0.3 % SOLN Apply to eye.   Yes [provider]  levothyroxine (SYNTHROID) 50 MCG tablet TAKE 1 TABLET BY MOUTH EVERY DAY 06/21/17  Yes [provider]  losartan (COZAAR) 50 MG tablet Take 50 mg by mouth daily. 08/04/20  Yes [provider]  magnesium 30 MG tablet Take 30 mg by mouth.   Yes [provider]  Melatonin 1 MG CAPS Take by mouth at bedtime as needed.   Yes [provider]  metoprolol succinate (TOPROL-XL)  50 MG 24 hr tablet Take 50 mg by mouth daily. 08/29/19 12/19/21 Yes [provider]  Multiple Vitamins-Minerals (MULTIVITAMIN ADULT, MINERALS, PO) Take 1 tablet by mouth daily.   Yes [provider]  sertraline (ZOLOFT) 50 MG tablet Take 50 mg by mouth at bedtime. 09/23/19 12/19/21 Yes [provider]  torsemide (DEMADEX) 20 MG tablet Take 20 mg by mouth daily. 08/29/19 12/19/21 Yes [provider]    Allergies as of 10/31/2021 - Review Complete 10/04/2020  Allergen Reaction Noted   Other Shortness Of Breath 10/23/2016   Codeine Other (See Comments) 07/20/2016   Sulfa antibiotics Other (See Comments) 08/09/2016   Sulfamethoxazole-trimethoprim  02/10/2013    Family History  Family history unknown: Yes    Social History   Socioeconomic History   Marital status: Widowed    Spouse name: Not on file   Number of children: Not on file   Years of education: Not on file   Highest education level: Not on file  Occupational History   Not on file  Tobacco Use   Smoking status: Former    Types: Cigarettes    Quit date: 74    Years since quitting: 66.4   Smokeless tobacco: Never  Vaping Use   Vaping Use: Never used  Substance and Sexual Activity   Alcohol use: No   Drug use: No   Sexual activity: Yes    Birth control/protection: Post-menopausal  Other Topics Concern   Not on  file  Social History Narrative   Not on file   Social Determinants of Health   Financial Resource Strain: Not on file  Food Insecurity: Not on file  Transportation Needs: Not on file  Physical Activity: Not on file  Stress: Not on file  Social Connections: Not on file  Intimate Partner Violence: Not on file    Review of Systems: See HPI, otherwise negative ROS  Physical Exam: BP (!) 169/75   Pulse 73   Temp (!) 97.2 F (36.2 C) (Temporal)   Ht 5\' 3"  (1.6 m)   Wt 90.7 kg   SpO2 97%   BMI 35.43 kg/m  General:   Alert, cooperative in NAD Head:  Normocephalic and  atraumatic. Respiratory:  Normal work of breathing. Cardiovascular:  RRR  Impression/Plan: Heather [name] is here for cataract surgery.  Risks, benefits, limitations, and alternatives regarding cataract surgery have been reviewed with the patient.  Questions have been answered.  All parties agreeable.   Benay Pillow, MD  12/19/2021, 10:43 AM

## 2021-12-19 NOTE — Anesthesia Postprocedure Evaluation (Signed)
Anesthesia Post Note  Patient: Heather [name]  Procedure(s) Performed: CATARACT EXTRACTION PHACO AND INTRAOCULAR LENS PLACEMENT (IOC) RIGHT 3.43 00:30.0 (Right: Eye)     Patient location during evaluation: PACU Anesthesia Type: MAC Level of consciousness: awake and alert Pain management: pain level controlled Vital Signs Assessment: post-procedure vital signs reviewed and stable Respiratory status: spontaneous breathing, nonlabored ventilation, respiratory function stable and patient connected to nasal cannula oxygen Cardiovascular status: stable and blood pressure returned to baseline Postop Assessment: no apparent nausea or vomiting Anesthetic complications: no   No notable events documented.  Fidel Levy

## 2021-12-19 NOTE — Anesthesia Preprocedure Evaluation (Signed)
Anesthesia Evaluation  Patient identified by MRN, date of birth, ID band Patient awake    Reviewed: Allergy & Precautions, H&P , NPO status , Patient's Chart, lab work & pertinent test results, reviewed documented beta blocker date and time   History of Anesthesia Complications Negative for: history of anesthetic complications  Airway Mallampati: II  TM Distance: >3 FB Neck ROM: full    Dental no notable dental hx. (+) Upper Dentures   Pulmonary neg pulmonary ROS, former smoker,    Pulmonary exam normal breath sounds clear to auscultation       Cardiovascular Exercise Tolerance: Good hypertension, +CHF (hx cardiomyopathy;)  Normal cardiovascular exam+ dysrhythmias (afib hx) + pacemaker (SSS, Bos-Sci CRT bi-V PM)  Rhythm:regular Rate:Normal  A- fib/ Complete Heart Block- s/p Pacemaker ;  echo: 07/2021: NORMAL LEFT VENTRICULAR SYSTOLIC FUNCTION WITH MILD LVH NORMAL RIGHT VENTRICULAR SYSTOLIC FUNCTION MILD VALVULAR REGURGITATION (See above) NO VALVULAR STENOSIS ESTIMATED LVEF >55% Aortic: MILD AI Mitral: MILD MR Tricuspid: MILD TR (3.29ms) Pulmonic: NORMAL GRADIENTS MILD LAE;    Neuro/Psych Anxiety negative neurological ROS     GI/Hepatic negative GI ROS, Neg liver ROS, Controlled,  Endo/Other  Hypothyroidism Morbid obesity (bmi 35)  Renal/GU negative Renal ROS  negative genitourinary   Musculoskeletal gout   Abdominal   Peds  Hematology negative hematology ROS (+)   Anesthesia Other Findings Bi-Ventricular Pacemaker : magnet a bedside;  Last eliquis: 6/4;  had ecce 12/05/2021;  pcp: 08/2021: hande; cards: 07/2021: kNehemiah Massed    Reproductive/Obstetrics negative OB ROS                             Anesthesia Physical  Anesthesia Plan  ASA: 3  Anesthesia Plan: MAC   Post-op Pain Management:    Induction:   PONV Risk Score and Plan: 3 and 2 and TIVA and Midazolam  Airway  Management Planned:   Additional Equipment:   Intra-op Plan:   Post-operative Plan:   Informed Consent: I have reviewed the patients History and Physical, chart, labs and discussed the procedure including the risks, benefits and alternatives for the proposed anesthesia with the patient or authorized representative who has indicated his/her understanding and acceptance.     Dental Advisory Given  Plan Discussed with: CRNA  Anesthesia Plan Comments:         Anesthesia Quick Evaluation

## 2021-12-20 ENCOUNTER — Encounter: Payer: Self-pay | Admitting: Ophthalmology

## 2022-01-15 IMAGING — CT CT CHEST W/ CM
1 series · 15 of 34 positions shown, 19 images · IV contrast (omnipaque)
Comparison: CT abdomen pelvis 10/15/2019 and CT chest 06/28/2016.

CLINICAL DATA: Pleural effusion, cystic lesion of abdominal
viscera, adnexal cyst.

EXAM:
CT CHEST WITH CONTRAST
TECHNIQUE: Multidetector CT imaging of the chest was performed during
intravenous contrast administration.
CONTRAST:  60mL OMNIPAQUE IOHEXOL 300 MG/ML  SOLN

[Series 2: axial st · axial · 0.62mm/px · z∈[-680,-378]mm · 15 of 179 slices shown, 19 images]
[im 14/179  mediastinal]
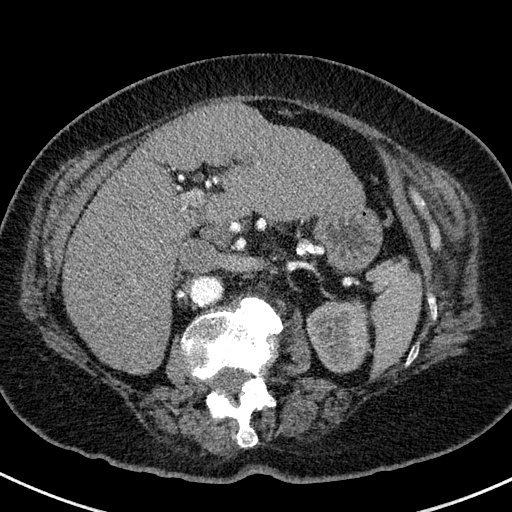
[im 14/179  lung]
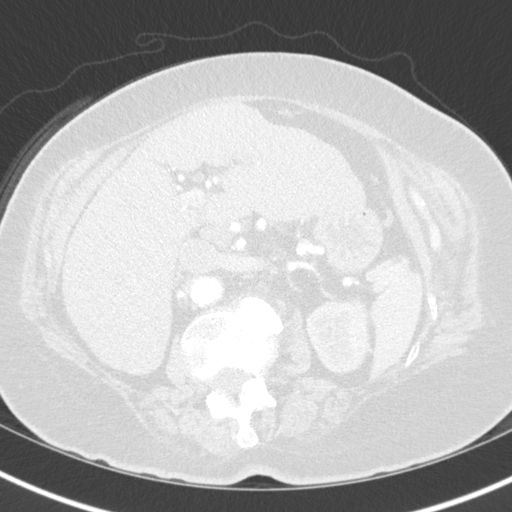
[im 27/179  lung]
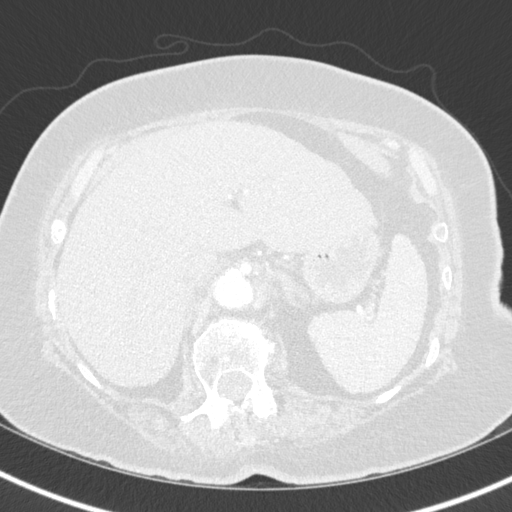
[im 36/179  lung]
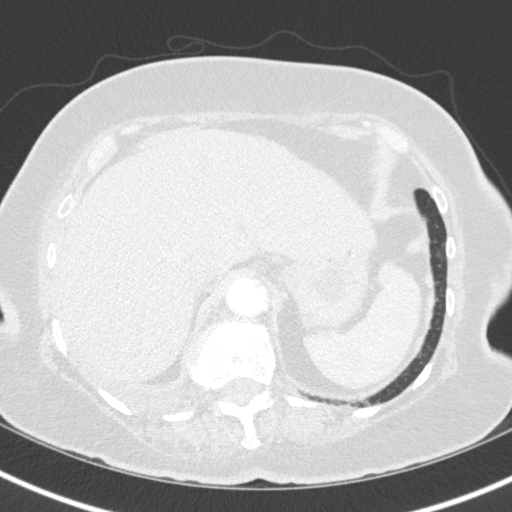
[im 47/179  lung]
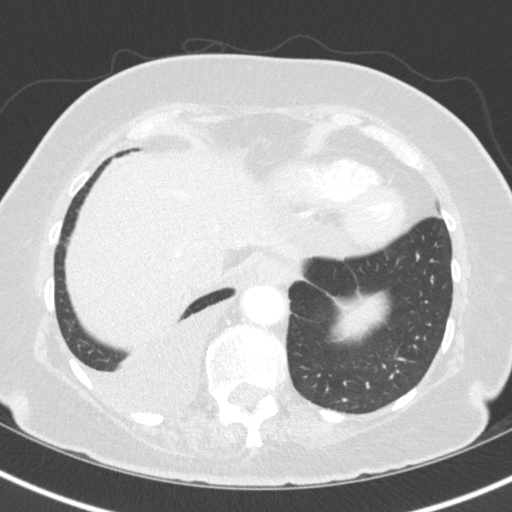
[im 60/179  mediastinal]
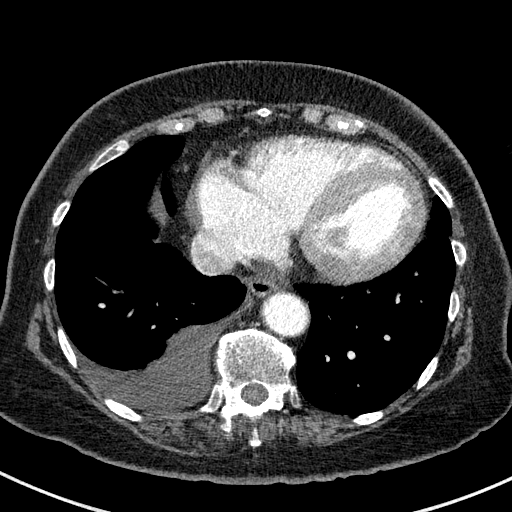
[im 60/179  lung]
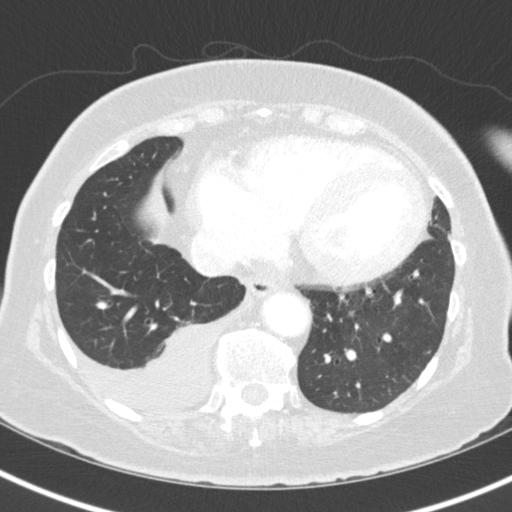
[im 72/179  lung]
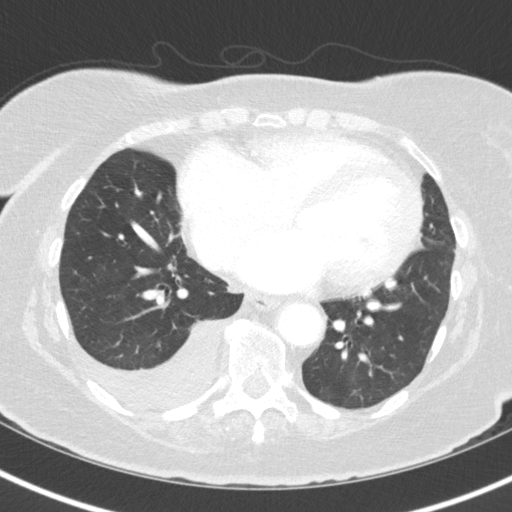
[im 80/179  lung]
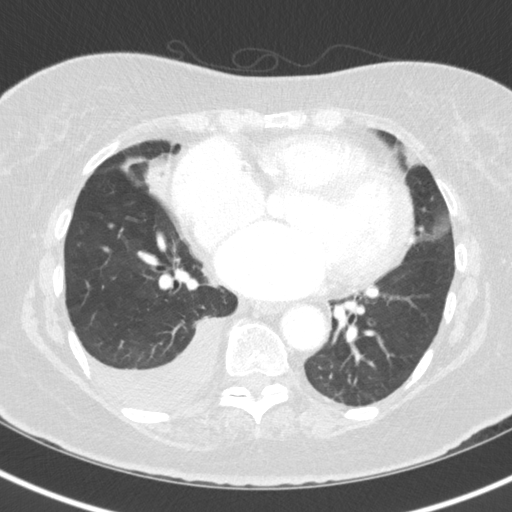
[im 93/179  lung]
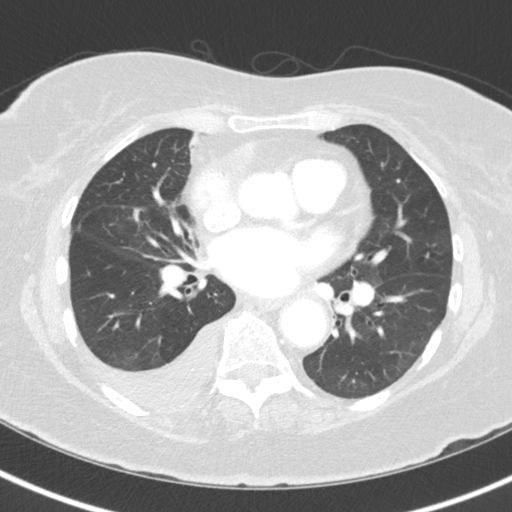
[im 99/179  mediastinal]
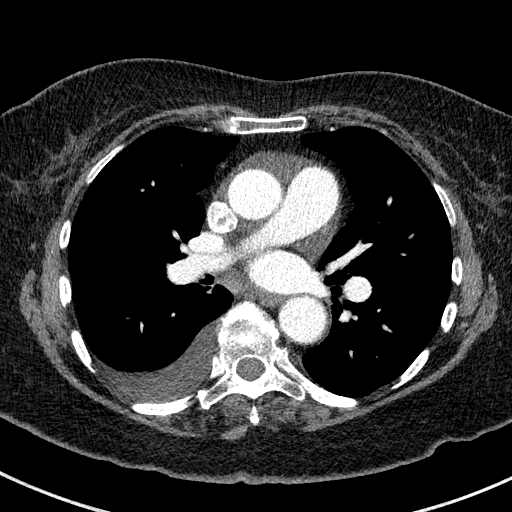
[im 99/179  lung]
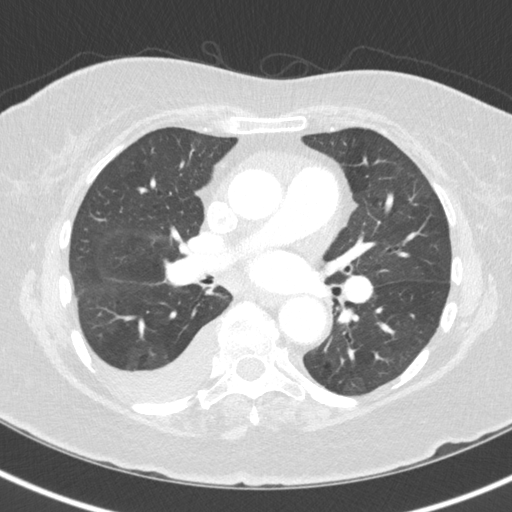
[im 107/179  lung]
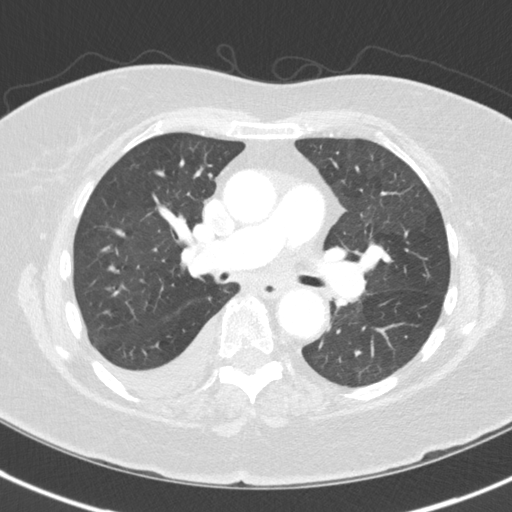
[im 119/179  lung]
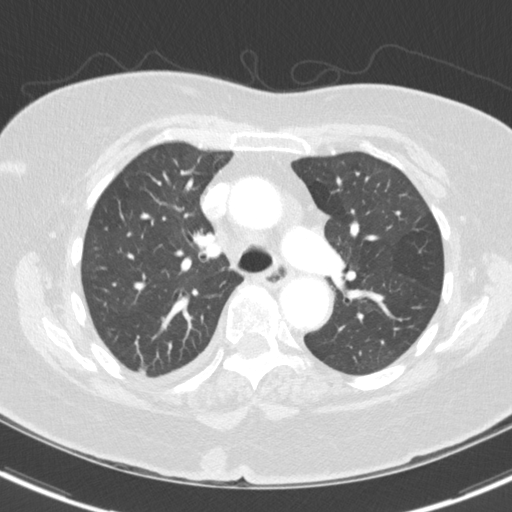
[im 132/179  lung]
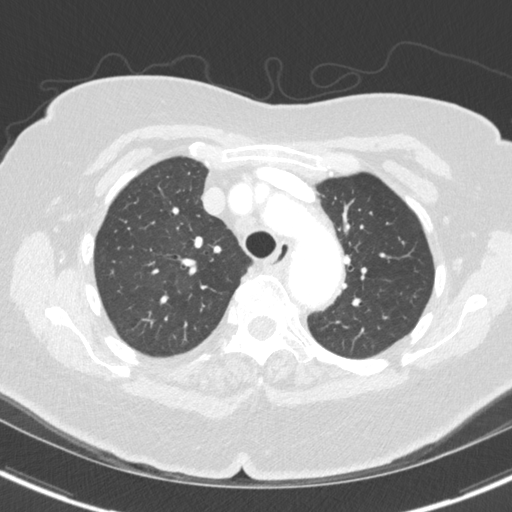
[im 143/179  mediastinal]
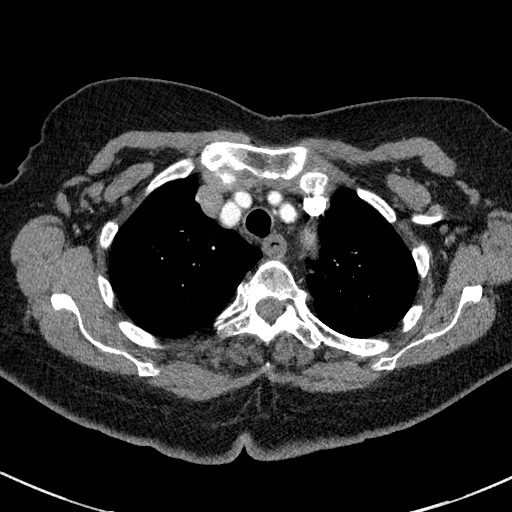
[im 143/179  lung]
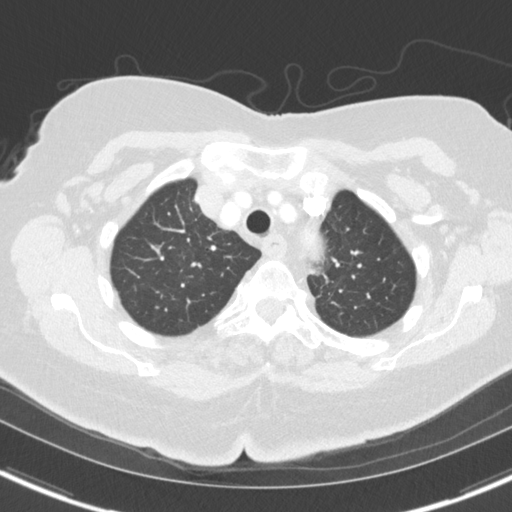
[im 152/179  lung]
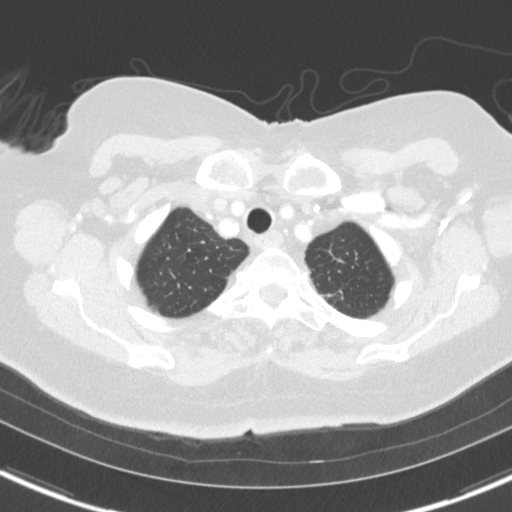
[im 165/179  lung]
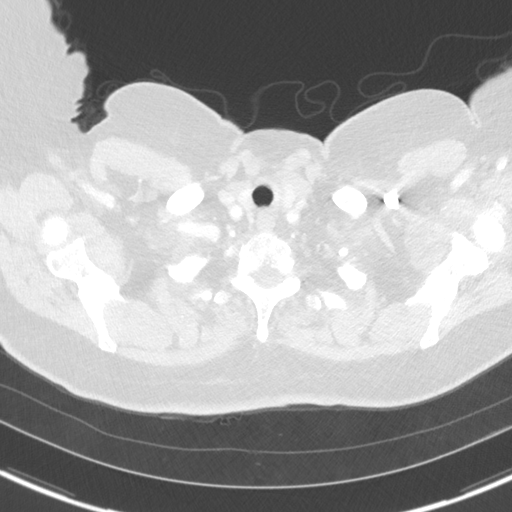

[15 of 34 positions shown; findings below may reference images not displayed]

FINDINGS: Cardiovascular: Atherosclerotic calcification of the aorta and
coronary arteries. Right and left pulmonary arteries and heart are
enlarged. Small amount of pericardial fluid is likely physiologic.

Mediastinum/Nodes: Mediastinal lymph nodes measure up to 9 mm in the
right paratracheal station, as on 06/28/2016. No hilar or axillary
adenopathy. Esophagus is unremarkable.

Lungs/Pleura: Biapical pleuroparenchymal scarring. Image quality is
somewhat degraded by respiratory motion. Volume loss and mild
bronchiectasis in the right middle lobe. Right pleural effusion,
similar to minimally enlarged from 10/15/2019. 3 mm peripheral left
lower lobe nodule, stable from 06/28/2016 and considered benign. No
pleural fluid. Airway is unremarkable.

Upper Abdomen: Liver margin is irregular. Stones are seen in the
gallbladder. Adrenal glands and visualized portions of the left
kidney, spleen, pancreas, stomach and bowel are grossly
unremarkable. Porta hepatis lymph nodes measure up to 10 mm, similar
to 06/28/2016.

Musculoskeletal: Degenerative changes in the spine. No worrisome
lytic or sclerotic lesions. A probable subcutaneous sebaceous cyst
is seen in the right paramidline overlying the spine.
IMPRESSION: 1. Moderate right pleural effusion, similar to minimally increased
from 10/15/2019. No evidence of primary malignancy in the chest.
2. Cirrhosis.
3. Cholelithiasis.
4. Aortic atherosclerosis ([id number]). Coronary artery
calcification.
5. Enlarged pulmonary arteries, indicative of pulmonary arterial
hypertension.

## 2022-12-20 IMAGING — CR DG ABDOMEN 1V
2 series · 2 of 2 positions shown · non-contrast
Comparison: None.

CLINICAL DATA: Constipation.

EXAM:
ABDOMEN - 1 VIEW

[abdomen kub (1 of 2)]
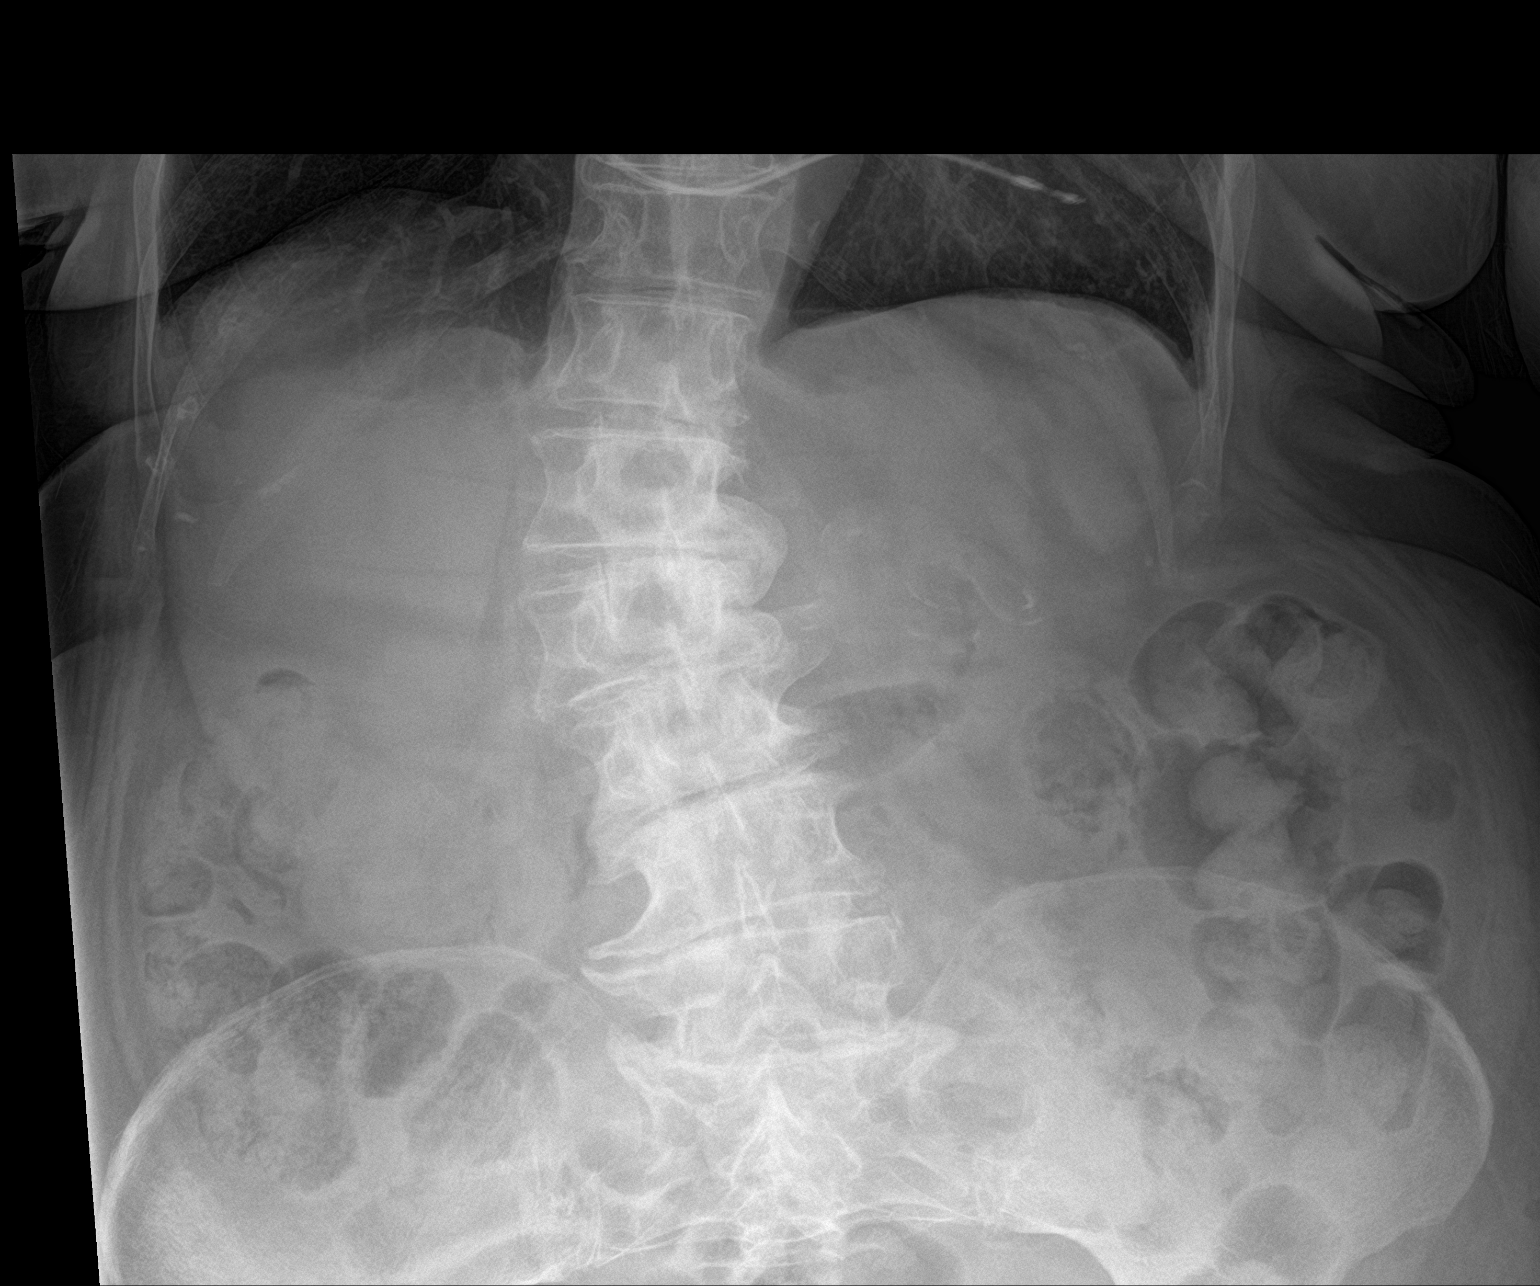

[abdomen kub (2 of 2)]
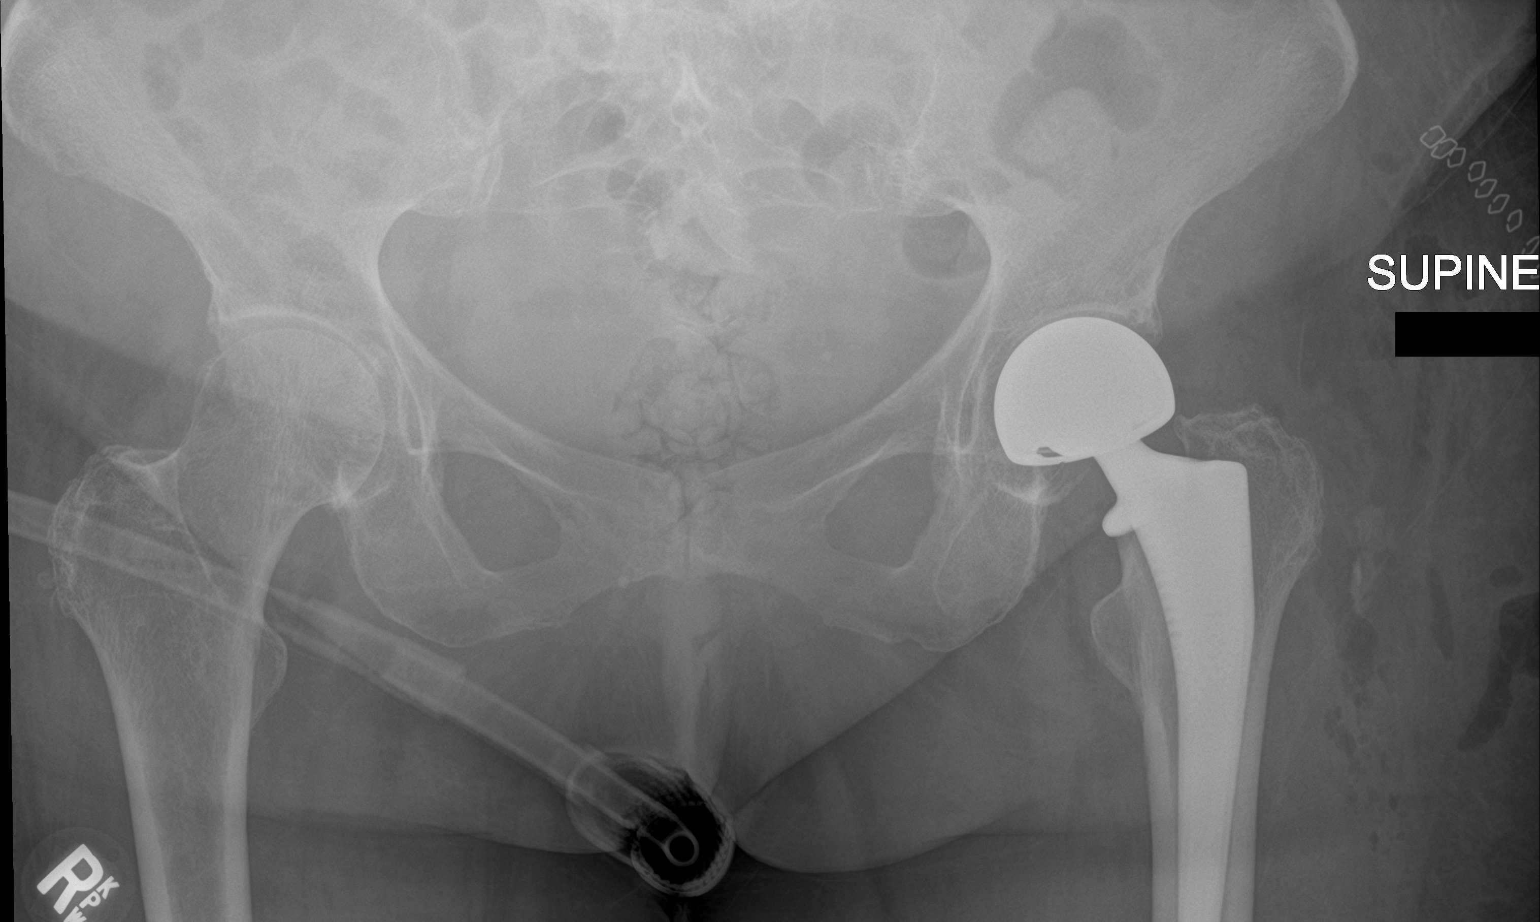

[2 of 2 positions shown; findings below may reference images not displayed]

FINDINGS: The bowel gas pattern is normal. Moderate amount of stool seen
throughout the colon. No radio-opaque calculi or other significant
radiographic abnormality are seen.
IMPRESSION: Moderate stool burden. No evidence of bowel obstruction or ileus.

## 2023-04-13 ENCOUNTER — Ambulatory Visit: Payer: Medicare Other | Admitting: Cardiovascular Disease

## 2023-06-17 DIAGNOSIS — I480 Paroxysmal atrial fibrillation: Secondary | ICD-10-CM | POA: Insufficient documentation

## 2023-06-17 DIAGNOSIS — I420 Dilated cardiomyopathy: Secondary | ICD-10-CM | POA: Insufficient documentation

## 2023-06-17 DIAGNOSIS — I700 Atherosclerosis of aorta: Secondary | ICD-10-CM | POA: Insufficient documentation

## 2023-06-17 DIAGNOSIS — I495 Sick sinus syndrome: Secondary | ICD-10-CM | POA: Insufficient documentation

## 2023-06-17 DIAGNOSIS — Z950 Presence of cardiac pacemaker: Secondary | ICD-10-CM | POA: Insufficient documentation

## 2023-06-17 NOTE — Progress Notes (Unsigned)
Cardiology Office Note  Date:  06/18/2023   ID:  FRANKEE LOCKLER, DOB [birth year], MRN [id number]  PCP:  Barbette Reichmann, MD   Chief Complaint  Patient presents with   New Patient (Initial Visit)    Establish care for permanent A-Fib, aortic atherosclerosis and HTN. "Doing well." Medications reviewed by the patient verbally.     HPI:  Ms. Heather [name] is a 89 year old woman with past medical history of atrial fibrillation, possibly permanent ,CHA2DS2-VASc - 4 (age2, sex, hf)  Right bundle branch block, left anterior fascicular block,  history of syncope after injection to eye, slow A-fib ventricular escape in the 30s Pacemaker, biventricular, [employer] BiV paced 98% of the time, April 23, 2020 Aortic atherosclerosis Essential hypertension Hyperlipidemia Cardiomyopathy, ejection fraction 30%, normalized on echo 2023 History of mechanical falls, hip fracture March 2022 requiring surgery Mild coronary calcification, aortic atherosclerosis on CT April 2021 Referred by primary care for follow-up of her paroxysmal atrial fibrillation  Previously followed by [facility] general cardiology and [facility] EP Moved in with son in law, daughter since fall, hip surgery 3/22  Prior imaging studies reviewed Echo: 04/23/2020 TTE 1. LV is upper normal in size with mildly increased wall thickness. 2. The left ventricular systolic function is moderately to severely decreased, LVEF is visually estimated at 30-35%. 3. Mitral annular calcification is present (mild). 4. There is mild mitral valve regurgitation. 5. The left atrium is mildly to moderately dilated in size. 6. The right ventricle is not well visualized but probably normal in size, with reduced systolic function. 7. There is moderate pulmonary hypertension, estimated pulmonary artery systolic pressure is 60 mmHg. 8. The right atrium is mildly dilated in size.  Follow-up echo January 2023  normal ejection fraction 55%, mild LVH mild  MR RVSP 44 mmHg  Lab work reviewed A1c 5.9 Total cholesterol 213 LDL 140 (up from 107)  Stress test February 2022  Pacer download reviewed from November 2024  Received alert February 19, 2023 concerning for VTE, unable to exclude A-fib with RVR At the time was ill with URI on breathing treatments/albuterol, lost power to her house 24 hours  EKG personally reviewed by myself on todays visit EKG Interpretation Date/Time:  Monday June 18 2023 15:04:12 EST Ventricular Rate:  73 PR Interval:    QRS Duration:  186 QT Interval:  492 QTC Calculation: 542 R Axis:   -45  Text Interpretation: Ventricular-paced rhythm When compared with ECG of 01-Oct-2020 08:06, Vent. rate has increased BY   2 BPM Confirmed by Julien Nordmann [phone]) on 06/18/2023 3:06:21 PM    PMH:   has a past medical history of Anxiety, Atrial fibrillation (HCC), CHF (congestive heart failure) (HCC), Complication of anesthesia, GERD (gastroesophageal reflux disease), Gout, Hypertension, Hypothyroidism, Presence of permanent cardiac pacemaker (04/23/2020), Thyroid disease, and Wears dentures.  PSH:    Past Surgical History:  Procedure Laterality Date   ABDOMINAL HYSTERECTOMY     APPENDECTOMY     CATARACT EXTRACTION W/PHACO Left 12/05/2021   Procedure: CATARACT EXTRACTION PHACO AND INTRAOCULAR LENS PLACEMENT (IOC) LEFT 3.72 00:37.1;  Surgeon: Nevada Crane, MD;  Location: [facility];  Service: Ophthalmology;  Laterality: Left;   CATARACT EXTRACTION W/PHACO Right 12/19/2021   Procedure: CATARACT EXTRACTION PHACO AND INTRAOCULAR LENS PLACEMENT (IOC) RIGHT 3.43 00:30.0;  Surgeon: Nevada Crane, MD;  Location: [facility];  Service: Ophthalmology;  Laterality: Right;   HIP ARTHROPLASTY Left 10/04/2020   Procedure: ARTHROPLASTY BIPOLAR HIP (HEMIARTHROPLASTY);  Surgeon: Lyndle Herrlich, MD;  Location: [facility];  Service: Orthopedics;  Laterality: Left;   INSERT / REPLACE / REMOVE PACEMAKER  04/23/2020     Current Outpatient Medications  Medication Sig Dispense Refill   acetaminophen (TYLENOL) 500 MG tablet Take 250 mg by mouth every 6 (six) hours as needed.     allopurinol (ZYLOPRIM) 100 MG tablet Take 1 tablet by mouth daily.     apixaban (ELIQUIS) 5 MG TABS tablet Take 5 mg by mouth 2 (two) times daily.     Dextran 70-Hypromellose (ARTIFICIAL TEARS) 0.1-0.3 % SOLN Apply to eye.     levothyroxine (SYNTHROID) 50 MCG tablet Take 50 mcg by mouth daily before breakfast.     losartan (COZAAR) 50 MG tablet Take 50 mg by mouth daily.     Melatonin 1 MG CAPS Take 0.5 mg by mouth at bedtime as needed.     metoprolol succinate (TOPROL-XL) 50 MG 24 hr tablet Take 50 mg by mouth daily.     Multiple Vitamins-Minerals (MULTIVITAMIN ADULT, MINERALS, PO) Take 1 tablet by mouth daily.     potassium chloride (KLOR-CON) 10 MEQ tablet Take 10 mEq by mouth daily as needed.     sertraline (ZOLOFT) 50 MG tablet Take 50 mg by mouth at bedtime.     torsemide (DEMADEX) 20 MG tablet Take 20 mg by mouth daily.     No current facility-administered medications for this visit.    Allergies:   Other, Codeine, Sulfa antibiotics, and Sulfamethoxazole-trimethoprim   Social History:  The patient  reports that she quit smoking about 67 years ago. Her smoking use included cigarettes. She has never used smokeless tobacco. She reports that she does not drink alcohol and does not use drugs.   Family History:   family history includes Heart block in her brother.    Review of Systems: Review of Systems  Constitutional: Negative.   HENT: Negative.    Respiratory: Negative.    Cardiovascular: Negative.   Gastrointestinal: Negative.   Musculoskeletal: Negative.   Neurological: Negative.   Psychiatric/Behavioral: Negative.    All other systems reviewed and are negative.   PHYSICAL EXAM: VS:  BP 120/80 (BP Location: Left Wrist, Patient Position: Sitting, Cuff Size: Normal)   Pulse 73   Ht 5' 5.5" (1.664 m)   Wt 213  lb (96.6 kg)   SpO2 92%   BMI 34.91 kg/m  , BMI Body mass index is 34.91 kg/m. GEN: Well nourished, well developed, in no acute distress HEENT: normal Neck: no JVD, carotid bruits, or masses Cardiac: RRR; no murmurs, rubs, or gallops,no edema  Respiratory:  clear to auscultation bilaterally, normal work of breathing GI: soft, nontender, nondistended, + BS MS: no deformity or atrophy Skin: warm and dry, no rash Neuro:  Strength and sensation are intact Psych: euthymic mood, full affect  Recent Labs: No results found for requested labs within last 365 days.    Lipid Panel No results found for: "CHOL", "HDL", "LDLCALC", "TRIG"    Wt Readings from Last 3 Encounters:  06/18/23 213 lb (96.6 kg)  12/19/21 200 lb (90.7 kg)  12/05/21 208 lb 14.4 oz (94.8 kg)     ASSESSMENT AND PLAN:  Problem List Items Addressed This Visit       Cardiology Problems   Paroxysmal atrial fibrillation (HCC) - Primary   Relevant Orders   EKG 12-Lead (Completed)   Sick sinus syndrome (HCC)   Relevant Orders   EKG 12-Lead (Completed)   Aortic atherosclerosis (HCC)   Relevant Orders  EKG 12-Lead (Completed)   Dilated cardiomyopathy (HCC)   Relevant Orders   EKG 12-Lead (Completed)   Benign essential HTN   Relevant Orders   EKG 12-Lead (Completed)   Hypertensive heart disease with congestive heart failure (HCC)   Relevant Orders   EKG 12-Lead (Completed)   HTN (hypertension)   Relevant Orders   EKG 12-Lead (Completed)     Other   Status post biventricular pacemaker   Relevant Orders   EKG 12-Lead (Completed)   Complete heart block/symptomatic bradycardia, pacer Biventricular pacer placed 2021 followed at [facility] permanent atrial fibrillation, biventricular paced rhythm On Eliquis 5 twice daily, metoprolol for rate control  Coronary calcification/aortic atherosclerosis History of hyperlipidemia, prefers not to be on cholesterol medication at this time Reports she is taking  various types of oils, will cut back on her meat Through strict diet able to get LDL down to 107, most recent LDL 140 Goal LDL less than 70  Essential hypertension Blood pressure is well controlled on today's visit. No changes made to the medications.  Debility/hip pain Prior mechanical fall, required hip surgery, has been living with family Has house in [place] but felt too unstable on her feet to move back by herself independently  Hyperlipidemia As discussed above she prefers not to be on medication at this time CT scan images pulled up and reviewed showing mild coronary calcification, aortic atherosclerosis  Prior outpatient records requested and reviewed, cardiac imaging pulled up and reviewed with her in detail Long discussion concerning management of cholesterol, pacer  Signed, Dossie Arbour, M.D., Ph.D. [facility] [place], [place] [phone]

## 2023-06-18 ENCOUNTER — Ambulatory Visit: Payer: Medicare Other | Attending: Cardiovascular Disease | Admitting: Cardiovascular Disease

## 2023-06-18 ENCOUNTER — Encounter: Payer: Self-pay | Admitting: Cardiovascular Disease

## 2023-06-18 VITALS — BP 120/80 | HR 73 | Ht 65.5 in | Wt 213.0 lb

## 2023-06-18 DIAGNOSIS — I480 Paroxysmal atrial fibrillation: Secondary | ICD-10-CM | POA: Diagnosis not present

## 2023-06-18 DIAGNOSIS — I495 Sick sinus syndrome: Secondary | ICD-10-CM

## 2023-06-18 DIAGNOSIS — I110 Hypertensive heart disease with heart failure: Secondary | ICD-10-CM

## 2023-06-18 DIAGNOSIS — I700 Atherosclerosis of aorta: Secondary | ICD-10-CM

## 2023-06-18 DIAGNOSIS — I5032 Chronic diastolic (congestive) heart failure: Secondary | ICD-10-CM

## 2023-06-18 DIAGNOSIS — I420 Dilated cardiomyopathy: Secondary | ICD-10-CM

## 2023-06-18 DIAGNOSIS — I10 Essential (primary) hypertension: Secondary | ICD-10-CM | POA: Diagnosis not present

## 2023-06-18 DIAGNOSIS — Z950 Presence of cardiac pacemaker: Secondary | ICD-10-CM

## 2023-06-18 NOTE — Patient Instructions (Addendum)
Medication Instructions:  No changes  Avoid albuterol inhaler and nebs If needed, next time use xopenex  If you need a refill on your cardiac medications before your next appointment, please call your pharmacy.   Lab work: No new labs needed  Testing/Procedures: No new testing needed  Follow-Up: At [facility], you and your health needs are our priority.  As part of our continuing mission to provide you with exceptional heart care, we have created designated Provider Care Teams.  These Care Teams include your primary Cardiologist (physician) and Advanced Practice Providers (APPs -  Physician Assistants and Nurse Practitioners) who all work together to provide you with the care you need, when you need it.  You will need a follow up appointment in 6 months  Providers on your designated Care Team:   Nicolasa Ducking, NP Eula Listen, PA-C Cadence Fransico Michael, [place]  COVID-19 Vaccine Information can be found at: [email or web address] For questions related to vaccine distribution or appointments, please email [email or web address] or call [phone].

## 2024-02-14 ENCOUNTER — Telehealth: Payer: Self-pay | Admitting: Cardiovascular Disease

## 2024-02-14 NOTE — Telephone Encounter (Signed)
 New Message:     Patient says she has a pacemaker and she gets it taken care in [facility]. She said to please tell Dr Gollan that she would like for him to take care of it in his office please.

## 2024-02-15 ENCOUNTER — Telehealth: Payer: Self-pay | Admitting: Cardiovascular Disease

## 2024-02-15 ENCOUNTER — Other Ambulatory Visit: Payer: Self-pay | Admitting: Emergency Medicine

## 2024-02-15 DIAGNOSIS — Z950 Presence of cardiac pacemaker: Secondary | ICD-10-CM

## 2024-02-15 NOTE — Telephone Encounter (Signed)
 Left voicemail, pt needs to be scheduled with EP from referral, per Dr. Gollan. Thank you.

## 2024-02-15 NOTE — Telephone Encounter (Signed)
 Left voice mail

## 2024-02-18 NOTE — Telephone Encounter (Signed)
 Left voicemail to schedule EP appointment from the referral. Thank you.

## 2024-02-21 NOTE — Telephone Encounter (Signed)
 Called and left a voice message

## 2024-05-05 ENCOUNTER — Ambulatory Visit: Admitting: Cardiovascular Disease

## 2024-06-22 NOTE — Progress Notes (Deleted)
 Cardiology Office Note  Date:  06/22/2024   ID:  Heather [name], DOB [birth year], MRN [id number]  PCP:  Sadie Manna, MD   No chief complaint on file.   HPI:  Ms. Heather [name] is a 89 year old woman with past medical history of atrial fibrillation, possibly permanent ,CHA2DS2-VASc - 4 (age2, sex, hf)  Right bundle branch block, left anterior fascicular block,  history of syncope after injection to eye, slow A-fib ventricular escape in the 30s Pacemaker, biventricular, [employer] BiV paced 98% of the time, April 23, 2020 Aortic atherosclerosis Essential hypertension Hyperlipidemia Cardiomyopathy, ejection fraction 30%, normalized on echo 2023 History of mechanical falls, hip fracture March 2022 requiring surgery Mild coronary calcification, aortic atherosclerosis on CT April 2021 Who presents for follow-up of her paroxysmal atrial fibrillation  LOC 12/24   Previously followed by [facility] general cardiology and [facility] EP Moved in with son in law, daughter since fall, hip surgery 3/22  Prior imaging studies reviewed Echo: 04/23/2020 TTE 1. LV is upper normal in size with mildly increased wall thickness. 2. The left ventricular systolic function is moderately to severely decreased, LVEF is visually estimated at 30-35%. 3. Mitral annular calcification is present (mild). 4. There is mild mitral valve regurgitation. 5. The left atrium is mildly to moderately dilated in size. 6. The right ventricle is not well visualized but probably normal in size, with reduced systolic function. 7. There is moderate pulmonary hypertension, estimated pulmonary artery systolic pressure is 60 mmHg. 8. The right atrium is mildly dilated in size.  Follow-up echo January 2023  normal ejection fraction 55%, mild LVH mild MR RVSP 44 mmHg  Lab work reviewed A1c 5.9 Total cholesterol 213 LDL 140 (up from 107)  Stress test February 2022  Pacer download reviewed from November 2024  Received  alert February 19, 2023 concerning for VTE, unable to exclude A-fib with RVR At the time was ill with URI on breathing treatments/albuterol , lost power to her house 24 hours  EKG personally reviewed by myself on todays visit      PMH:   has a past medical history of Anxiety, Atrial fibrillation (HCC), CHF (congestive heart failure) (HCC), Complication of anesthesia, GERD (gastroesophageal reflux disease), Gout, Hypertension, Hypothyroidism, Presence of permanent cardiac pacemaker (04/23/2020), Thyroid  disease, and Wears dentures.  PSH:    Past Surgical History:  Procedure Laterality Date   ABDOMINAL HYSTERECTOMY     APPENDECTOMY     CATARACT EXTRACTION W/PHACO Left 12/05/2021   Procedure: CATARACT EXTRACTION PHACO AND INTRAOCULAR LENS PLACEMENT (IOC) LEFT 3.72 00:37.1;  Surgeon: Myrna Adine Anes, MD;  Location: [facility];  Service: Ophthalmology;  Laterality: Left;   CATARACT EXTRACTION W/PHACO Right 12/19/2021   Procedure: CATARACT EXTRACTION PHACO AND INTRAOCULAR LENS PLACEMENT (IOC) RIGHT 3.43 00:30.0;  Surgeon: Myrna Adine Anes, MD;  Location: [facility];  Service: Ophthalmology;  Laterality: Right;   HIP ARTHROPLASTY Left 10/04/2020   Procedure: ARTHROPLASTY BIPOLAR HIP (HEMIARTHROPLASTY);  Surgeon: Leora Lynwood SAUNDERS, MD;  Location: [facility];  Service: Orthopedics;  Laterality: Left;   INSERT / REPLACE / REMOVE PACEMAKER  04/23/2020    Current Outpatient Medications  Medication Sig Dispense Refill   acetaminophen  (TYLENOL ) 500 MG tablet Take 250 mg by mouth every 6 (six) hours as needed.     allopurinol (ZYLOPRIM) 100 MG tablet Take 1 tablet by mouth daily.     apixaban  (ELIQUIS ) 5 MG TABS tablet Take 5 mg by mouth 2 (two) times daily.     Dextran 70-Hypromellose (ARTIFICIAL  TEARS) 0.1-0.3 % SOLN Apply to eye.     levothyroxine  (SYNTHROID ) 50 MCG tablet Take 50 mcg by mouth daily before breakfast.     losartan  (COZAAR ) 50 MG tablet Take 50 mg by mouth daily.      Melatonin 1 MG CAPS Take 0.5 mg by mouth at bedtime as needed.     metoprolol  succinate (TOPROL -XL) 50 MG 24 hr tablet Take 50 mg by mouth daily.     Multiple Vitamins-Minerals (MULTIVITAMIN ADULT, MINERALS, PO) Take 1 tablet by mouth daily.     potassium chloride  (KLOR-CON ) 10 MEQ tablet Take 10 mEq by mouth daily as needed.     sertraline  (ZOLOFT ) 50 MG tablet Take 50 mg by mouth at bedtime.     torsemide  (DEMADEX ) 20 MG tablet Take 20 mg by mouth daily.     No current facility-administered medications for this visit.    Allergies:   Other, Codeine, Sulfa antibiotics, and Sulfamethoxazole-trimethoprim   Social History:  The patient  reports that she quit smoking about 68 years ago. Her smoking use included cigarettes. She has never used smokeless tobacco. She reports that she does not drink alcohol and does not use drugs.   Family History:   family history includes Heart block in her brother.    Review of Systems: Review of Systems  Constitutional: Negative.   HENT: Negative.    Respiratory: Negative.    Cardiovascular: Negative.   Gastrointestinal: Negative.   Musculoskeletal: Negative.   Neurological: Negative.   Psychiatric/Behavioral: Negative.    All other systems reviewed and are negative.   PHYSICAL EXAM: VS:  There were no vitals taken for this visit. , BMI There is no height or weight on file to calculate BMI. GEN: Well nourished, well developed, in no acute distress HEENT: normal Neck: no JVD, carotid bruits, or masses Cardiac: RRR; no murmurs, rubs, or gallops,no edema  Respiratory:  clear to auscultation bilaterally, normal work of breathing GI: soft, nontender, nondistended, + BS MS: no deformity or atrophy Skin: warm and dry, no rash Neuro:  Strength and sensation are intact Psych: euthymic mood, full affect  Recent Labs: No results found for requested labs within last 365 days.    Lipid Panel No results found for: CHOL, HDL, LDLCALC, TRIG     Wt Readings from Last 3 Encounters:  06/18/23 213 lb (96.6 kg)  12/19/21 200 lb (90.7 kg)  12/05/21 208 lb 14.4 oz (94.8 kg)     ASSESSMENT AND PLAN:  Problem List Items Addressed This Visit   None   Complete heart block/symptomatic bradycardia, pacer Biventricular pacer placed 2021 followed at [facility] permanent atrial fibrillation, biventricular paced rhythm On Eliquis  5 twice daily, metoprolol  for rate control  Coronary calcification/aortic atherosclerosis History of hyperlipidemia, prefers not to be on cholesterol medication at this time Reports she is taking various types of oils, will cut back on her meat Through strict diet able to get LDL down to 107, most recent LDL 140 Goal LDL less than 70  Essential hypertension Blood pressure is well controlled on today's visit. No changes made to the medications.  Debility/hip pain Prior mechanical fall, required hip surgery, has been living with family Has house in [place] but felt too unstable on her feet to move back by herself independently  Hyperlipidemia As discussed above she prefers not to be on medication at this time CT scan images pulled up and reviewed showing mild coronary calcification, aortic atherosclerosis  Prior outpatient records requested and reviewed, cardiac  imaging pulled up and reviewed with her in detail Long discussion concerning management of cholesterol, pacer  Signed, Velinda Lunger, M.D., Ph.D. [facility] [place], [place] [phone]

## 2024-06-23 ENCOUNTER — Ambulatory Visit: Admitting: Cardiovascular Disease

## 2024-06-23 DIAGNOSIS — I420 Dilated cardiomyopathy: Secondary | ICD-10-CM

## 2024-06-23 DIAGNOSIS — I480 Paroxysmal atrial fibrillation: Secondary | ICD-10-CM

## 2024-06-23 DIAGNOSIS — I110 Hypertensive heart disease with heart failure: Secondary | ICD-10-CM

## 2024-06-23 DIAGNOSIS — I10 Essential (primary) hypertension: Secondary | ICD-10-CM

## 2024-06-23 DIAGNOSIS — I495 Sick sinus syndrome: Secondary | ICD-10-CM

## 2024-06-23 DIAGNOSIS — I700 Atherosclerosis of aorta: Secondary | ICD-10-CM

## 2024-08-12 ENCOUNTER — Ambulatory Visit: Admitting: Cardiovascular Disease

## 2024-10-20 ENCOUNTER — Ambulatory Visit: Admitting: Cardiovascular Disease
